# Patient Record
Sex: Male | Born: 1955
Health system: Southern US, Community
[De-identification: ages and names within clinical notes are randomized; demographics above are authoritative.]

## PROBLEM LIST (undated history)

## (undated) DIAGNOSIS — N401 Enlarged prostate with lower urinary tract symptoms: Secondary | ICD-10-CM

## (undated) DIAGNOSIS — E785 Hyperlipidemia, unspecified: Secondary | ICD-10-CM

## (undated) DIAGNOSIS — N138 Other obstructive and reflux uropathy: Secondary | ICD-10-CM

## (undated) DIAGNOSIS — Z860101 Personal history of adenomatous and serrated colon polyps: Secondary | ICD-10-CM

## (undated) DIAGNOSIS — H8110 Benign paroxysmal vertigo, unspecified ear: Secondary | ICD-10-CM

## (undated) DIAGNOSIS — J189 Pneumonia, unspecified organism: Secondary | ICD-10-CM

## (undated) DIAGNOSIS — R972 Elevated prostate specific antigen [PSA]: Secondary | ICD-10-CM

## (undated) DIAGNOSIS — Z86010 Personal history of colonic polyps: Secondary | ICD-10-CM

## (undated) DIAGNOSIS — R7989 Other specified abnormal findings of blood chemistry: Secondary | ICD-10-CM

## (undated) DIAGNOSIS — G4730 Sleep apnea, unspecified: Secondary | ICD-10-CM

## (undated) DIAGNOSIS — A6000 Herpesviral infection of urogenital system, unspecified: Secondary | ICD-10-CM

## (undated) DIAGNOSIS — R7303 Prediabetes: Secondary | ICD-10-CM

## (undated) DIAGNOSIS — R911 Solitary pulmonary nodule: Secondary | ICD-10-CM

## (undated) DIAGNOSIS — J309 Allergic rhinitis, unspecified: Secondary | ICD-10-CM

## (undated) HISTORY — PX: TRANSTHORACIC ECHOCARDIOGRAM: SHX275

## (undated) HISTORY — DX: Personal history of adenomatous and serrated colon polyps: Z86.0101

## (undated) HISTORY — DX: Other obstructive and reflux uropathy: N13.8

## (undated) HISTORY — DX: Pneumonia, unspecified organism: J18.9

## (undated) HISTORY — DX: Solitary pulmonary nodule: R91.1

## (undated) HISTORY — DX: Benign paroxysmal vertigo, unspecified ear: H81.10

## (undated) HISTORY — PX: OTHER SURGICAL HISTORY: SHX169

## (undated) HISTORY — DX: Personal history of colonic polyps: Z86.010

## (undated) HISTORY — DX: Other obstructive and reflux uropathy: N40.1

## (undated) HISTORY — DX: Hyperlipidemia, unspecified: E78.5

## (undated) HISTORY — DX: Prediabetes: R73.03

## (undated) HISTORY — DX: Other specified abnormal findings of blood chemistry: R79.89

## (undated) HISTORY — DX: Elevated prostate specific antigen (PSA): R97.20

## (undated) HISTORY — DX: Allergic rhinitis, unspecified: J30.9

## (undated) HISTORY — PX: ABLATION, PROSTATE, TRANSURETHRAL, USING WATERJET: SHX7150

## (undated) HISTORY — DX: Herpesviral infection of urogenital system, unspecified: A60.00

## (undated) HISTORY — DX: Sleep apnea, unspecified: G47.30

---
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
## 2010-04-13 HISTORY — PX: COLONOSCOPY: SHX174

## 2016-06-12 LAB — HEPATIC FUNCTION PANEL
ALT: 15 (ref 10–40)
AST: 25 (ref 14–40)
Alkaline Phosphatase: 69 (ref 25–125)
Bilirubin, Total: 0.7

## 2016-06-12 LAB — BASIC METABOLIC PANEL
BUN: 16 (ref 4–21)
Creatinine: 1.3 (ref 0.6–1.3)
Glucose: 86
Potassium: 3.8 (ref 3.4–5.3)
Sodium: 140 (ref 137–147)

## 2016-06-12 LAB — LIPID PANEL
Cholesterol: 209 — AB (ref 0–200)
HDL: 74 — AB (ref 35–70)
LDL Cholesterol: 128
LDl/HDL Ratio: 1.7
Triglycerides: 36 — AB (ref 40–160)

## 2016-06-12 LAB — PSA: PSA: 6.51

## 2016-06-12 LAB — TSH: TSH: 5.43 (ref 0.41–5.90)

## 2016-06-24 LAB — VITAMIN B12: Vitamin B-12: 746

## 2016-10-07 LAB — CBC AND DIFFERENTIAL
HCT: 41 (ref 41–53)
HEMOGLOBIN: 14 (ref 13.5–17.5)
Neutrophils Absolute: 3
Platelets: 217 (ref 150–399)
WBC: 5.9

## 2016-10-07 LAB — IRON,TIBC AND FERRITIN PANEL
%SAT: 33
Ferritin: 155
Iron: 108
TIBC: 328

## 2016-10-17 LAB — FECAL OCCULT BLOOD, GUAIAC: Fecal Occult Blood: NEGATIVE

## 2017-12-17 DIAGNOSIS — L570 Actinic keratosis: Secondary | ICD-10-CM | POA: Diagnosis not present

## 2017-12-17 DIAGNOSIS — X32XXXA Exposure to sunlight, initial encounter: Secondary | ICD-10-CM | POA: Diagnosis not present

## 2017-12-17 DIAGNOSIS — D225 Melanocytic nevi of trunk: Secondary | ICD-10-CM | POA: Diagnosis not present

## 2017-12-17 DIAGNOSIS — L821 Other seborrheic keratosis: Secondary | ICD-10-CM | POA: Diagnosis not present

## 2017-12-17 DIAGNOSIS — Z1283 Encounter for screening for malignant neoplasm of skin: Secondary | ICD-10-CM | POA: Diagnosis not present

## 2018-05-06 DIAGNOSIS — H8110 Benign paroxysmal vertigo, unspecified ear: Secondary | ICD-10-CM | POA: Diagnosis not present

## 2018-05-06 DIAGNOSIS — J3089 Other allergic rhinitis: Secondary | ICD-10-CM | POA: Diagnosis not present

## 2018-06-09 ENCOUNTER — Ambulatory Visit: Payer: 59 | Admitting: Family Medicine

## 2018-06-09 ENCOUNTER — Encounter: Payer: Self-pay | Admitting: Family Medicine

## 2018-06-09 VITALS — BP 113/75 | HR 74 | Temp 98.3°F | Resp 16 | Ht 69.5 in | Wt 191.0 lb

## 2018-06-09 DIAGNOSIS — R7989 Other specified abnormal findings of blood chemistry: Secondary | ICD-10-CM

## 2018-06-09 DIAGNOSIS — E663 Overweight: Secondary | ICD-10-CM

## 2018-06-09 DIAGNOSIS — Z1211 Encounter for screening for malignant neoplasm of colon: Secondary | ICD-10-CM

## 2018-06-09 DIAGNOSIS — E611 Iron deficiency: Secondary | ICD-10-CM | POA: Diagnosis not present

## 2018-06-09 DIAGNOSIS — J300 Vasomotor rhinitis: Secondary | ICD-10-CM

## 2018-06-09 DIAGNOSIS — N401 Enlarged prostate with lower urinary tract symptoms: Secondary | ICD-10-CM

## 2018-06-09 DIAGNOSIS — E7800 Pure hypercholesterolemia, unspecified: Secondary | ICD-10-CM | POA: Diagnosis not present

## 2018-06-09 DIAGNOSIS — R7301 Impaired fasting glucose: Secondary | ICD-10-CM

## 2018-06-09 DIAGNOSIS — R972 Elevated prostate specific antigen [PSA]: Secondary | ICD-10-CM

## 2018-06-09 DIAGNOSIS — Z23 Encounter for immunization: Secondary | ICD-10-CM

## 2018-06-09 DIAGNOSIS — Z0000 Encounter for general adult medical examination without abnormal findings: Secondary | ICD-10-CM

## 2018-06-09 DIAGNOSIS — R0683 Snoring: Secondary | ICD-10-CM

## 2018-06-09 DIAGNOSIS — J309 Allergic rhinitis, unspecified: Secondary | ICD-10-CM

## 2018-06-09 LAB — COMPREHENSIVE METABOLIC PANEL
ALT: 19 U/L (ref 0–53)
AST: 31 U/L (ref 0–37)
Albumin: 4.5 g/dL (ref 3.5–5.2)
Alkaline Phosphatase: 63 U/L (ref 39–117)
BUN: 16 mg/dL (ref 6–23)
CO2: 27 mEq/L (ref 19–32)
Calcium: 9.5 mg/dL (ref 8.4–10.5)
Chloride: 101 mEq/L (ref 96–112)
Creatinine, Ser: 1.18 mg/dL (ref 0.40–1.50)
GFR: 62.31 mL/min (ref >60.00)
Glucose, Bld: 85 mg/dL (ref 70–99)
Potassium: 4.2 mEq/L (ref 3.5–5.1)
Sodium: 137 mEq/L (ref 135–145)
Total Bilirubin: 0.6 mg/dL (ref 0.2–1.2)
Total Protein: 7.5 g/dL (ref 6.0–8.3)

## 2018-06-09 LAB — CBC WITH DIFFERENTIAL/PLATELET
BASOS ABS: 0.1 10*3/uL (ref 0.0–0.1)
Basophils Relative: 1.2 % (ref 0.0–3.0)
EOS ABS: 0.2 10*3/uL (ref 0.0–0.7)
Eosinophils Relative: 3.3 % (ref 0.0–5.0)
HCT: 43.3 % (ref 39.0–52.0)
Hemoglobin: 15 g/dL (ref 13.0–17.0)
Lymphocytes Relative: 26.1 % (ref 12.0–46.0)
Lymphs Abs: 1.2 10*3/uL (ref 0.7–4.0)
MCHC: 34.7 g/dL (ref 30.0–36.0)
MCV: 96.6 fl (ref 78.0–100.0)
Monocytes Absolute: 0.6 10*3/uL (ref 0.1–1.0)
Monocytes Relative: 12.9 % — ABNORMAL HIGH (ref 3.0–12.0)
Neutro Abs: 2.7 10*3/uL (ref 1.4–7.7)
Neutrophils Relative %: 56.5 % (ref 43.0–77.0)
Platelets: 326 10*3/uL (ref 150.0–400.0)
RBC: 4.49 Mil/uL (ref 4.22–5.81)
RDW: 13.3 % (ref 11.5–15.5)
WBC: 4.7 10*3/uL (ref 4.0–10.5)

## 2018-06-09 LAB — T4, FREE: Free T4: 0.72 ng/dL (ref 0.60–1.60)

## 2018-06-09 LAB — LIPID PANEL
CHOLESTEROL: 226 mg/dL — AB (ref 0–200)
HDL: 70.2 mg/dL (ref >39.00)
LDL Cholesterol: 135 mg/dL — ABNORMAL HIGH (ref 0–99)
NonHDL: 155.79
Total CHOL/HDL Ratio: 3
Triglycerides: 103 mg/dL (ref 0.0–149.0)
VLDL: 20.6 mg/dL (ref 0.0–40.0)

## 2018-06-09 LAB — HEMOGLOBIN A1C: Hgb A1c MFr Bld: 5.7 % (ref 4.6–6.5)

## 2018-06-09 LAB — FERRITIN: Ferritin: 151.6 ng/mL (ref 22.0–322.0)

## 2018-06-09 LAB — TSH: TSH: 5.02 u[IU]/mL — ABNORMAL HIGH (ref 0.35–4.50)

## 2018-06-09 LAB — PSA: PSA: 9.05 ng/mL — ABNORMAL HIGH (ref 0.10–4.00)

## 2018-06-09 NOTE — Progress Notes (Signed)
Office Note 06/09/2018  CC:  Chief Complaint  Patient presents with  . Establish Care    Previous PCP: [facility] in [place], [place] Dr. Theda Sers  . Abnormal Lab    thyroid, iron, CBC, PSA, lipid    HPI:  Christopher Banks is a 63 y.o.  male who is here to establish care Patient's most recent primary MD: see above Old records were not available for review prior to or during today's visit.  Most recent CPE with labs was 06/2016: reviewed labs today. Main issues:  Nocturia x 3, also urinary frequency in daytime, stream intermittenly poor, some incomplete bladder emptying, no pain with urination.  No hx of urinary infections.  Tamsulosin->dizziness.  No other meds.  Hx of elevated PSA: steadily increasing since 12/2014--> 3.95 12/2014, 4.68 May 30, 2015, and 6.51 Mar 2018. Pt states he has had no abnl prostate exams except enlargement.  Has never seen a urologist.  TSH ULN 2016 and 2017, then was 5.43 06/2016.  NO T4 or T3 checked. Needs rechecking.  Iron low 06/2016 (26, % sat = 8, normal TIBC and ferritin and Hb and MCV).  Pt took iron supp x 3 mo and iron rose to normal range.  He recently restarted a daily ferrous sulfate 325mg  tab a few weeks ago due to feeling some fatigue. IFOB 10/2016 was NEG.  CMET all normal 2018.  Vit B12 normal 2016 and 2018.  His wife says that he sometimes awakes from sleep with a gasp.  Pt not sure about apneic events in sleep. He denies any excessive daytime sleepiness.  No morning headaches. He does have night time nasal congestion that is bothersome, says this responded to a nasal spray in the past but he cannot recall the name.  Past Medical History:  Diagnosis Date  . Allergic rhinitis    dust mites  . BPH with obstruction/lower urinary tract symptoms   . Elevated TSH    2018, not taking biotin.  Marland Kitchen Hyperlipidemia    borderline-->meds not recommended in the past    Past Surgical History:  Procedure Laterality Date  . COLONOSCOPY   2012   Normal    Family History  Problem Relation Age of Onset  . Hearing loss Mother   . Hyperlipidemia Mother   . Irritable bowel syndrome Sister   . Other Brother        elevated PSA  . Diabetes Maternal Grandfather     Social History   Socioeconomic History  . Marital status: Married    Spouse name: Not on file  . Number of children: Not on file  . Years of education: Not on file  . Highest education level: Not on file  Occupational History  . Not on file  Social Needs  . Financial resource strain: Not on file  . Food insecurity:    Worry: Not on file    Inability: Not on file  . Transportation needs:    Medical: Not on file    Non-medical: Not on file  Tobacco Use  . Smoking status: Never Smoker  . Smokeless tobacco: Never Used  Substance and Sexual Activity  . Alcohol use: Yes    Comment: occasionally  . Drug use: Never  . Sexual activity: Not on file  Lifestyle  . Physical activity:    Days per week: Not on file    Minutes per session: Not on file  . Stress: Not on file  Relationships  . Social connections:  Talks on phone: Not on file    Gets together: Not on file    Attends religious service: Not on file    Active member of club or organization: Not on file    Attends meetings of clubs or organizations: Not on file    Relationship status: Not on file  . Intimate partner violence:    Fear of current or ex partner: Not on file    Emotionally abused: Not on file    Physically abused: Not on file    Forced sexual activity: Not on file  Other Topics Concern  . Not on file  Social History Narrative   Married, 2 children.   Orig from [place], relocated to [place].   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   No tob.   Alc: 2 glasses of wine per night.    Outpatient Encounter Medications as of 06/09/2018  Medication Sig  . Cholecalciferol (VITAMIN D3) 50 MCG (2000 UT) capsule Take 2,000 Units by mouth daily.  . ferrous  sulfate 325 (65 FE) MG EC tablet Take 325 mg by mouth daily.  . Multiple Vitamin (MULTIVITAMIN) tablet Take 1 tablet by mouth daily.   No facility-administered encounter medications on file as of 06/09/2018.     Allergies  Allergen Reactions  . Tamsulosin Other (See Comments)    lightheadedness    ROS Review of Systems  Constitutional: Positive for fatigue (mild x few weeks). Negative for appetite change, chills and fever.  HENT: Positive for congestion (nasal). Negative for dental problem, ear pain and sore throat.   Eyes: Negative for discharge, redness and visual disturbance.  Respiratory: Positive for cough (mild, x 3 d, with uri sx's). Negative for chest tightness, shortness of breath and wheezing.   Cardiovascular: Negative for chest pain, palpitations and leg swelling.  Gastrointestinal: Negative for abdominal pain, blood in stool, diarrhea, nausea and vomiting.  Genitourinary: Positive for difficulty urinating and frequency. Negative for dysuria, flank pain and hematuria.  Musculoskeletal: Negative for arthralgias, back pain, joint swelling, myalgias and neck stiffness.  Skin: Negative for pallor and rash.  Neurological: Negative for dizziness, speech difficulty, weakness and headaches.  Hematological: Negative for adenopathy. Does not bruise/bleed easily.  Psychiatric/Behavioral: Negative for confusion and sleep disturbance. The patient is not nervous/anxious.     PE; Blood pressure 113/75, pulse 74, temperature 98.3 F (36.8 C), temperature source Oral, resp. rate 16, height 5' 9.5" (1.765 m), weight 191 lb (86.6 kg), SpO2 95 %. Body mass index is 27.8 kg/m.  Gen: Alert, well appearing.  Patient is oriented to person, place, time, and situation. AFFECT: pleasant, lucid thought and speech. ENT: Ears: EACs clear, normal epithelium.  TMs with good light reflex and landmarks bilaterally.  Eyes: no injection, icteris, swelling, or exudate.  EOMI, PERRLA. Nose: no drainage or  turbinate edema/swelling.  No injection or focal lesion.  Mouth: lips without lesion/swelling.  Oral mucosa pink and moist.  Dentition intact and without obvious caries or gingival swelling.  Oropharynx without erythema, exudate, or swelling.  Neck: supple/nontender.  No LAD, mass, or TM.  Carotid pulses 2+ bilaterally, without bruits. CV: RRR, no m/r/g.   LUNGS: CTA bilat, nonlabored resps, good aeration in all lung fields. ABD: soft, NT, ND, BS normal.  No hepatospenomegaly or mass.  No bruits. EXT: no clubbing, cyanosis, or edema.  Musculoskeletal: no joint swelling, erythema, warmth, or tenderness.  ROM of all joints intact. Skin - no sores or suspicious lesions or  rashes or color changes Rectal exam: negative without mass, lesions or tenderness, PROSTATE EXAM: smooth and symmetric without nodules or tenderness, enlarged 1+.   Pertinent labs:  none  ASSESSMENT AND PLAN:   New pt;  1) Viral URI, symptomatic care discussed.  2) Vasomotor rhinitis vs allergic rhinitis: I recommended he start otc flonase and see how things go. If not helpful will rx astelin spray.  3) Hx of iron def (w/out anemia): recheck iron panel and CBC today.  4) Hx of elevated PSA: trending upward the last 3 checks. Repeat today. Suspect he'll need to see a urologist.  5) BPH: flomax caused lightheadedness.  Will hold off on any trial of different bph treatment until we see how his PSA issue goes.  6) Borderline hyperlipidemia: reviewed lipid panels pt brought with him and these show only mild LDL elevations and low chol/hdl ratios.  Repeat FLP today.  7) Snoring: nasal congestion-->see #2 above.   No apneic events witnessed and no problem with excessive daytime sleepiness. Watchful waiting at this time.  8) Elevated TSH: VERY mild elevation, no T4 or T3 checked yet. Will repeat TSH today and also check T4 and T3.  9) Health maintenance exam: Reviewed age and gender appropriate health maintenance issues  (prudent diet, regular exercise, health risks of tobacco and excessive alcohol, use of seatbelts, fire alarms in home, use of sunscreen).  Also reviewed age and gender appropriate health screening as well as vaccine recommendations. Vaccines: flu vaccine today. Labs: CBC, CMET, FLP, TSH. Prostate ca screening: DRE normal today, PSA drawn. Colon ca screening: next colonoscopy due 2022.  An After Visit Summary was printed and given to the patient.  Return for f/u to be determined based on lab work up.  Signed:  Crissie Sickles, MD           06/09/2018

## 2018-06-10 ENCOUNTER — Encounter: Payer: Self-pay | Admitting: Family Medicine

## 2018-06-10 ENCOUNTER — Other Ambulatory Visit: Payer: Self-pay | Admitting: Family Medicine

## 2018-06-10 DIAGNOSIS — R972 Elevated prostate specific antigen [PSA]: Secondary | ICD-10-CM

## 2018-06-10 LAB — T3: T3, Total: 84 ng/dL (ref 76–181)

## 2018-06-10 NOTE — Progress Notes (Signed)
Orders only

## 2018-08-08 DIAGNOSIS — R972 Elevated prostate specific antigen [PSA]: Secondary | ICD-10-CM | POA: Diagnosis not present

## 2018-08-08 LAB — PSA: PSA: 9

## 2018-08-12 ENCOUNTER — Encounter: Payer: Self-pay | Admitting: Family Medicine

## 2018-09-12 HISTORY — PX: PROSTATE BIOPSY: SHX241

## 2018-09-22 DIAGNOSIS — R972 Elevated prostate specific antigen [PSA]: Secondary | ICD-10-CM | POA: Diagnosis not present

## 2018-09-30 DIAGNOSIS — R972 Elevated prostate specific antigen [PSA]: Secondary | ICD-10-CM | POA: Diagnosis not present

## 2018-10-03 ENCOUNTER — Encounter: Payer: Self-pay | Admitting: Family Medicine

## 2018-10-24 ENCOUNTER — Other Ambulatory Visit: Payer: Self-pay

## 2018-10-24 ENCOUNTER — Other Ambulatory Visit: Payer: Self-pay | Admitting: Family Medicine

## 2019-03-14 DIAGNOSIS — U071 COVID-19: Secondary | ICD-10-CM

## 2019-03-14 HISTORY — DX: COVID-19: U07.1

## 2019-03-28 DIAGNOSIS — R972 Elevated prostate specific antigen [PSA]: Secondary | ICD-10-CM | POA: Diagnosis not present

## 2019-04-04 ENCOUNTER — Other Ambulatory Visit: Payer: 59

## 2019-04-04 DIAGNOSIS — U071 COVID-19: Secondary | ICD-10-CM | POA: Diagnosis not present

## 2019-04-04 DIAGNOSIS — Z20828 Contact with and (suspected) exposure to other viral communicable diseases: Secondary | ICD-10-CM | POA: Diagnosis not present

## 2019-04-27 ENCOUNTER — Ambulatory Visit (INDEPENDENT_AMBULATORY_CARE_PROVIDER_SITE_OTHER): Payer: BC Managed Care – PPO | Admitting: Family Medicine

## 2019-04-27 ENCOUNTER — Encounter: Payer: Self-pay | Admitting: Family Medicine

## 2019-04-27 ENCOUNTER — Ambulatory Visit (HOSPITAL_BASED_OUTPATIENT_CLINIC_OR_DEPARTMENT_OTHER)
Admission: RE | Admit: 2019-04-27 | Discharge: 2019-04-27 | Disposition: A | Discharge status: Home / Self Care | Payer: BC Managed Care – PPO | Source: Ambulatory Visit | Attending: Family Medicine | Admitting: Family Medicine

## 2019-04-27 ENCOUNTER — Other Ambulatory Visit: Payer: Self-pay

## 2019-04-27 VITALS — BP 125/77 | HR 93 | Temp 97.9°F | Resp 16 | Wt 190.0 lb

## 2019-04-27 DIAGNOSIS — R0609 Other forms of dyspnea: Secondary | ICD-10-CM

## 2019-04-27 DIAGNOSIS — R0602 Shortness of breath: Secondary | ICD-10-CM | POA: Diagnosis not present

## 2019-04-27 DIAGNOSIS — R0600 Dyspnea, unspecified: Secondary | ICD-10-CM | POA: Diagnosis not present

## 2019-04-27 DIAGNOSIS — R000 Tachycardia, unspecified: Secondary | ICD-10-CM | POA: Insufficient documentation

## 2019-04-27 DIAGNOSIS — Z8616 Personal history of COVID-19: Secondary | ICD-10-CM | POA: Diagnosis not present

## 2019-04-27 NOTE — Progress Notes (Signed)
OFFICE VISIT  04/27/2019   CC:  Chief Complaint  Patient presents with  . Follow-up    Covid + test on 12/20 and hasn't felt right since.    HPI:    Patient is a 64 y.o.  male who presents for f/u of recent covid 19 infection. Covid + on 04/04/19 (23 days ago).  Onset 12/22, fever only, no other sx's at all.  Lasted couple days and felt back to baseline. He then started checking oxygen at home (still felt fine though) and it was 94% consistently. However, HR registering 80-110.  He felt no palpitations.  At rest he has no SOB.  With activity like a normal walk he feels SOB.  Feels no more SOB after resting 5-10 seconds. No chest pain, possibly a little pressure.  No legs swelling or pain. Good appetite, eating normally.  Urinating fine, no GI complaint.  ROS: no fever, no wheezing, no cough, no dizziness, no HAs, no rashes, no melena/hematochezia.  No polyuria or polydipsia.  No myalgias or arthralgias. No focal weakness, no generalized weakness, no tremor, no vision or hearing c/o, no n/v/d. No cold/heat intolerance.  No known swollen glands.     Past Medical History:  Diagnosis Date  . Allergic rhinitis    dust mites  . BPH with obstruction/lower urinary tract symptoms   . BPPV (benign paroxysmal positional vertigo)   . Elevated PSA    Urol 08/08/18-->prostate bx to be done (Dr. Alyson Ingles).  . Elevated TSH    2018, not taking biotin. 2020, same-->T4/T3 normal.  . Hyperlipidemia    borderline-->meds not recommended in the past  Prostate bx was NEG 08/2018.  Past Surgical History:  Procedure Laterality Date  . COLONOSCOPY  2012   Normal   Silodasin instead of alfuzosin. Outpatient Medications Prior to Visit  Medication Sig Dispense Refill  . Cholecalciferol (VITAMIN D3) 50 MCG (2000 UT) capsule Take 2,000 Units by mouth daily.    . ferrous sulfate 325 (65 FE) MG EC tablet Take 325 mg by mouth daily.    . fluticasone (FLONASE) 50 MCG/ACT nasal spray INSTILL 2 SPRAYS INTO  EACH NOSTRIL EVERY DAY 16 g 1  . Multiple Vitamin (MULTIVITAMIN) tablet Take 1 tablet by mouth daily.    Marland Kitchen alfuzosin (UROXATRAL) 10 MG 24 hr tablet TK 1 T PO HS    . fluticasone (FLONASE) 50 MCG/ACT nasal spray INSTILL 2 SPRAYS INTO EACH NOSTRIL QD     No facility-administered medications prior to visit.    Allergies  Allergen Reactions  . Tamsulosin Other (See Comments)    lightheadedness    ROS As per HPI  PE: Blood pressure 125/77, pulse 93, temperature 97.9 F (36.6 C), temperature source Temporal, resp. rate 16, weight 190 lb (86.2 kg), SpO2 94 %. Body mass index is 27.66 kg/m.  Gen: Alert, well appearing.  Patient is oriented to person, place, time, and situation. AFFECT: pleasant, lucid thought and speech. [id number]: no injection, icteris, swelling, or exudate.  EOMI, PERRLA. Mouth: lips without lesion/swelling.  Oral mucosa pink and moist. Oropharynx without erythema, exudate, or swelling.  CV: RRR, no m/r/g.   LUNGS: CTA bilat, nonlabored resps, good aeration in all lung fields. ABD: soft, NT/ND. EXT: no clubbing or cyanosis.  no edema.  Skin - no sores or suspicious lesions or rashes or color changes Musculoskeletal: no joint swelling, erythema, warmth, or tenderness.  ROM of all joints intact. Neuro: CN 2-12 intact bilaterally, strength 5/5 in proximal and distal upper extremities  and lower extremities bilaterally.    No tremor.    No ataxia.     LABS:   Lab Results  Component Value Date   TSH 5.02 (H) 06/09/2018   T3TOTAL 84 06/09/2018    Lab Results  Component Value Date   WBC 4.7 06/09/2018   HGB 15.0 06/09/2018   HCT 43.3 06/09/2018   MCV 96.6 06/09/2018   PLT 326.0 06/09/2018   Lab Results  Component Value Date   CREATININE 1.18 06/09/2018   BUN 16 06/09/2018   NA 137 06/09/2018   K 4.2 06/09/2018   CL 101 06/09/2018   CO2 27 06/09/2018   Lab Results  Component Value Date   ALT 19 06/09/2018   AST 31 06/09/2018   ALKPHOS 63 06/09/2018    BILITOT 0.6 06/09/2018   Lab Results  Component Value Date   CHOL 226 (H) 06/09/2018   Lab Results  Component Value Date   HDL 70.20 06/09/2018   Lab Results  Component Value Date   LDLCALC 135 (H) 06/09/2018   Lab Results  Component Value Date   TRIG 103.0 06/09/2018   Lab Results  Component Value Date   CHOLHDL 3 06/09/2018   Lab Results  Component Value Date   PSA 9 08/08/2018   PSA 9.05 (H) 06/09/2018   PSA 6.51 06/12/2016   Lab Results  Component Value Date   HGBA1C 5.7 06/09/2018   12 lead EKG today: (no prior EKG for comparison).  NSR, rate 73, no ischemic changes, no ectopy, intervals and duration normal.  Low voltage aVL, III, and aVF.  Tiny isolated Q wave in III. Essentially normal EKG.  IMPRESSION AND PLAN:  Post-covid DOE and intermittent tachycardia (mild->up to 110 or so). Normal exam, EKG normal. W/u to r/o pneumonia, PE, CHF. LABS: CBC w/diff, BMET (stat), D-dimer (stat), TSH, BNP. CXR now. If D dimer + then CT angio chest.  An After Visit Summary was printed and given to the patient.  FOLLOW UP: Return for f/u to be determined based on results of w/u.  Signed:  Crissie Sickles, MD           04/27/2019

## 2019-04-28 ENCOUNTER — Other Ambulatory Visit: Payer: Self-pay | Admitting: Family Medicine

## 2019-04-28 ENCOUNTER — Telehealth: Payer: Self-pay | Admitting: Family Medicine

## 2019-04-28 DIAGNOSIS — R911 Solitary pulmonary nodule: Secondary | ICD-10-CM

## 2019-04-28 LAB — CBC WITH DIFFERENTIAL/PLATELET
Absolute Monocytes: 374 cells/uL (ref 200–950)
Basophils Absolute: 20 cells/uL (ref 0–200)
Basophils Relative: 0.5 %
Eosinophils Absolute: 140 cells/uL (ref 15–500)
Eosinophils Relative: 3.6 %
HCT: 44.7 % (ref 38.5–50.0)
Hemoglobin: 15 g/dL (ref 13.2–17.1)
Lymphs Abs: 1232 cells/uL (ref 850–3900)
MCH: 32.5 pg (ref 27.0–33.0)
MCHC: 33.6 g/dL (ref 32.0–36.0)
MCV: 96.8 fL (ref 80.0–100.0)
MPV: 9.1 fL (ref 7.5–12.5)
Monocytes Relative: 9.6 %
Neutro Abs: 2133 cells/uL (ref 1500–7800)
Neutrophils Relative %: 54.7 %
Platelets: 307 10*3/uL (ref 140–400)
RBC: 4.62 10*6/uL (ref 4.20–5.80)
RDW: 11.8 % (ref 11.0–15.0)
Total Lymphocyte: 31.6 %
WBC: 3.9 10*3/uL (ref 3.8–10.8)

## 2019-04-28 LAB — BASIC METABOLIC PANEL
BUN: 18 mg/dL (ref 7–25)
CO2: 28 mmol/L (ref 20–32)
Calcium: 9.7 mg/dL (ref 8.6–10.3)
Chloride: 103 mmol/L (ref 98–110)
Creat: 1.1 mg/dL (ref 0.70–1.25)
Glucose, Bld: 110 mg/dL — ABNORMAL HIGH (ref 65–99)
Potassium: 4.4 mmol/L (ref 3.5–5.3)
Sodium: 136 mmol/L (ref 135–146)

## 2019-04-28 LAB — D-DIMER, QUANTITATIVE: D-Dimer, Quant: 0.33 mcg/mL FEU (ref <0.50)

## 2019-04-28 LAB — TSH: TSH: 3.97 mIU/L (ref 0.40–4.50)

## 2019-04-28 LAB — BRAIN NATRIURETIC PEPTIDE: Brain Natriuretic Peptide: 10 pg/mL (ref <100)

## 2019-04-28 NOTE — Telephone Encounter (Signed)
Patient states he is returning a call from Dr. Anitra Lauth from last night.  Please call  [phone]

## 2019-04-28 NOTE — Telephone Encounter (Signed)
I called pt at 12:15 again today and discussed all results with him. He does not need a call-back at this time.

## 2019-04-28 NOTE — Telephone Encounter (Signed)
Did you contact patient last night? He was advised of lab results but did have CXR done yesterday.

## 2019-05-04 DIAGNOSIS — R972 Elevated prostate specific antigen [PSA]: Secondary | ICD-10-CM | POA: Diagnosis not present

## 2019-05-04 LAB — PSA: PSA: 7.3

## 2019-05-12 ENCOUNTER — Other Ambulatory Visit: Payer: BC Managed Care – PPO

## 2019-05-15 ENCOUNTER — Other Ambulatory Visit: Payer: Self-pay

## 2019-05-15 ENCOUNTER — Ambulatory Visit
Admission: RE | Admit: 2019-05-15 | Discharge: 2019-05-15 | Disposition: A | Discharge status: Home / Self Care | Payer: BC Managed Care – PPO | Source: Ambulatory Visit | Attending: Family Medicine | Admitting: Family Medicine

## 2019-05-15 DIAGNOSIS — R918 Other nonspecific abnormal finding of lung field: Secondary | ICD-10-CM | POA: Diagnosis not present

## 2019-05-15 DIAGNOSIS — R911 Solitary pulmonary nodule: Secondary | ICD-10-CM

## 2019-05-15 MED ORDER — IOPAMIDOL (ISOVUE-300) INJECTION 61%
75.0000 mL | Freq: Once | INTRAVENOUS | Status: AC | PRN
  Administered 2019-05-15: 75 mL via INTRAVENOUS

## 2019-05-16 ENCOUNTER — Encounter: Payer: Self-pay | Admitting: Family Medicine

## 2019-05-16 ENCOUNTER — Telehealth: Payer: Self-pay

## 2019-05-16 NOTE — Telephone Encounter (Signed)
Patient had several questions for PCP. Advised to make virtual to further discuss recent CT results. Pt scheduled.

## 2019-05-16 NOTE — Telephone Encounter (Signed)
Received results from CT.  Has some additional questions.  Please call [phone]

## 2019-05-17 ENCOUNTER — Encounter: Payer: Self-pay | Admitting: Family Medicine

## 2019-05-17 ENCOUNTER — Ambulatory Visit (INDEPENDENT_AMBULATORY_CARE_PROVIDER_SITE_OTHER): Payer: BC Managed Care – PPO | Admitting: Family Medicine

## 2019-05-17 ENCOUNTER — Other Ambulatory Visit: Payer: Self-pay

## 2019-05-17 VITALS — Temp 97.6°F | Wt 187.0 lb

## 2019-05-17 DIAGNOSIS — R000 Tachycardia, unspecified: Secondary | ICD-10-CM | POA: Diagnosis not present

## 2019-05-17 DIAGNOSIS — R0602 Shortness of breath: Secondary | ICD-10-CM | POA: Diagnosis not present

## 2019-05-17 DIAGNOSIS — Z8616 Personal history of COVID-19: Secondary | ICD-10-CM | POA: Diagnosis not present

## 2019-05-17 NOTE — Progress Notes (Signed)
Virtual Visit via Video Note  I connected with Christopher Banks on 05/17/19 at  8:00 AM EST by a video enabled telemedicine application and verified that I am speaking with the correct person using two identifiers.  Location patient: home Location provider:work or home office Persons participating in the virtual visit: patient, provider  I discussed the limitations of evaluation and management by telemedicine and the availability of in person appointments. The patient expressed understanding and agreed to proceed.  Telemedicine visit is a necessity given the COVID-19 restrictions in place at the current time.  HPI: 64 y/o male being seen today for "discuss CT results". He had a CXR done 04/27/19 (covid +, SOB) that was normal other than a small nodular opacity in lower lung region on lateral view.  CT chest with contrast was recommended by radiologist for further evaluation. This CT was done 05/15/19 and showed: IMPRESSION: The nodular density seen on recent chest x-ray corresponds to endplate calcifications inferiorly on the left at T11.  There is however a 5 mm nodule within the right lower lobe as described. No other sizable nodules are seen. No follow-up needed if patient is low-risk. Non-contrast chest CT can be considered in 12 months if patient is high-risk. This recommendation follows the consensus statement: Guidelines for Management of Incidental Pulmonary Nodules Detected on CT Images: From the Fleischner Society 2017; Radiology 2017; 284:228-243. 8 mm hypodense nodule within the right lobe of the thyroid. Given the patient's age and size of the lesion, no followup is recommended(ref: J Am Coll Radiol. 2015 Feb;12(2): 143-50).  He is still having some feeling of mild SOB when he gets up and tries to be active. No palpitations or CP.  No diaphoresis, nausea, dizziness, or cough.  No fevers. This morning-->typical morning-> HR 70s at rest, goes to a bit over 100 when gets up and moves  around.  Oxygen sats normal.   No fatigue or malaise.   ROS: See pertinent positives and negatives per HPI.  Past Medical History:  Diagnosis Date  . Allergic rhinitis    dust mites  . BPH with obstruction/lower urinary tract symptoms   . BPPV (benign paroxysmal positional vertigo)   . Elevated PSA    Urol 08/08/18-->prostate bx to be done (Dr. Alyson Ingles).  . Elevated TSH    2018, not taking biotin. 2020, same-->T4/T3 normal.  . Hyperlipidemia    borderline-->meds not recommended in the past  . Pulmonary nodule Jan/feb 2021   5 mm R lung; Christopher Banks low risk for lung ca so NO F/U IMAGING IS INDICATED.    Past Surgical History:  Procedure Laterality Date  . COLONOSCOPY  2012   Normal    Family History  Problem Relation Age of Onset  . Hearing loss Mother   . Hyperlipidemia Mother   . Irritable bowel syndrome Sister   . Other Brother        elevated PSA  . Diabetes Maternal Grandfather    Social History   Socioeconomic History  . Marital status: Married    Spouse name: Not on file  . Number of children: Not on file  . Years of education: Not on file  . Highest education level: Not on file  Occupational History  . Not on file  Tobacco Use  . Smoking status: Never Smoker  . Smokeless tobacco: Never Used  Substance and Sexual Activity  . Alcohol use: Yes    Comment: occasionally  . Drug use: Never  . Sexual activity: Not on file  Other Topics Concern  . Not on file  Social History Narrative   Married, 2 children.   Orig from [place], relocated to [place].   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   Alc: 2 glasses of wine per night.   Social Determinants of Health   Financial Resource Strain:   . Difficulty of Paying Living Expenses: Not on file  Food Insecurity:   . Worried About Charity fundraiser in the Last Year: Not on file  . Ran Out of Food in the Last Year: Not on file  Transportation Needs:   . Lack of Transportation  (Medical): Not on file  . Lack of Transportation (Non-Medical): Not on file  Physical Activity:   . Days of Exercise per Week: Not on file  . Minutes of Exercise per Session: Not on file  Stress:   . Feeling of Stress : Not on file  Social Connections:   . Frequency of Communication with Friends and Family: Not on file  . Frequency of Social Gatherings with Friends and Family: Not on file  . Attends Religious Services: Not on file  . Active Member of Clubs or Organizations: Not on file  . Attends Archivist Meetings: Not on file  . Marital Status: Not on file      Current Outpatient Medications:  .  alfuzosin (UROXATRAL) 10 MG 24 hr tablet, TK 1 T PO HS, Disp: , Rfl:  .  Cholecalciferol (VITAMIN D3) 50 MCG (2000 UT) capsule, Take 2,000 Units by mouth daily., Disp: , Rfl:  .  ferrous sulfate 325 (65 FE) MG EC tablet, Take 325 mg by mouth daily., Disp: , Rfl:  .  fluticasone (FLONASE) 50 MCG/ACT nasal spray, INSTILL 2 SPRAYS INTO EACH NOSTRIL EVERY DAY, Disp: 16 g, Rfl: 1 .  Multiple Vitamin (MULTIVITAMIN) tablet, Take 1 tablet by mouth daily., Disp: , Rfl:   EXAM:  VITALS per patient if applicable: Temp [id number] F (36.4 C) (Oral)   Wt 187 lb (84.8 kg)   BMI 27.22 kg/m    GENERAL: alert, oriented, appears well and in no acute distress  HEENT: atraumatic, conjunttiva clear, no obvious abnormalities on inspection of external nose and ears  NECK: normal movements of the head and neck  LUNGS: on inspection no signs of respiratory distress, breathing rate appears normal, no obvious gross SOB, gasping or wheezing  CV: no obvious cyanosis  MS: moves all visible extremities without noticeable abnormality  PSYCH/NEURO: pleasant and cooperative, no obvious depression or anxiety, speech and thought processing grossly intact  LABS: none today    Chemistry      Component Value Date/Time   NA 136 04/27/2019 0846   NA 140 06/12/2016 0000   K 4.4 04/27/2019 0846   CL 103  04/27/2019 0846   CO2 28 04/27/2019 0846   BUN 18 04/27/2019 0846   BUN 16 06/12/2016 0000   CREATININE 1.10 04/27/2019 0846   GLU 86 06/12/2016 0000      Component Value Date/Time   CALCIUM 9.7 04/27/2019 0846   ALKPHOS 63 06/09/2018 1007   AST 31 06/09/2018 1007   ALT 19 06/09/2018 1007   BILITOT 0.6 06/09/2018 1007     Lab Results  Component Value Date   WBC 3.9 04/27/2019   HGB 15.0 04/27/2019   HCT 44.7 04/27/2019   MCV 96.8 04/27/2019   PLT 307 04/27/2019   Lab Results  Component Value Date  DDIMER 0.33 04/27/2019   ASSESSMENT AND PLAN:  Discussed the following assessment and plan:  Covid 19 infection approx 6 wks ago, still with some residual feeling of SOB with activity. Otherwise feeling well. W/u has been unrevealing of any cause or red flags. Discussed recent CT chest as noted in HPI.  No f/u imaging indicated. Reassured. Watchful waiting, expect gradual resolution over time.   I discussed the assessment and treatment plan with the patient. The patient was provided an opportunity to ask questions and all were answered. The patient agreed with the plan and demonstrated an understanding of the instructions.   The patient was advised to call back or seek an in-person evaluation if the symptoms worsen or if the condition fails to improve as anticipated.  F/u: prn  Signed:  Crissie Sickles, MD           05/17/2019

## 2019-05-18 ENCOUNTER — Encounter: Payer: Self-pay | Admitting: Family Medicine

## 2019-05-22 ENCOUNTER — Other Ambulatory Visit: Payer: Self-pay | Admitting: Urology

## 2019-05-30 ENCOUNTER — Encounter: Payer: Self-pay | Admitting: Family Medicine

## 2019-07-13 ENCOUNTER — Encounter: Payer: Self-pay | Admitting: Family Medicine

## 2019-07-13 ENCOUNTER — Other Ambulatory Visit: Payer: Self-pay

## 2019-07-13 ENCOUNTER — Ambulatory Visit (INDEPENDENT_AMBULATORY_CARE_PROVIDER_SITE_OTHER): Payer: BC Managed Care – PPO | Admitting: Family Medicine

## 2019-07-13 VITALS — BP 119/77 | HR 74 | Temp 97.8°F | Resp 16 | Ht 69.5 in | Wt 192.2 lb

## 2019-07-13 DIAGNOSIS — Z0000 Encounter for general adult medical examination without abnormal findings: Secondary | ICD-10-CM | POA: Diagnosis not present

## 2019-07-13 DIAGNOSIS — J309 Allergic rhinitis, unspecified: Secondary | ICD-10-CM | POA: Diagnosis not present

## 2019-07-13 DIAGNOSIS — G4733 Obstructive sleep apnea (adult) (pediatric): Secondary | ICD-10-CM | POA: Diagnosis not present

## 2019-07-13 DIAGNOSIS — R7301 Impaired fasting glucose: Secondary | ICD-10-CM

## 2019-07-13 LAB — COMPREHENSIVE METABOLIC PANEL
ALT: 20 U/L (ref 0–53)
AST: 30 U/L (ref 0–37)
Albumin: 4.3 g/dL (ref 3.5–5.2)
Alkaline Phosphatase: 49 U/L (ref 39–117)
BUN: 16 mg/dL (ref 6–23)
CO2: 29 mEq/L (ref 19–32)
Calcium: 9.7 mg/dL (ref 8.4–10.5)
Chloride: 102 mEq/L (ref 96–112)
Creatinine, Ser: 1.21 mg/dL (ref 0.40–1.50)
GFR: 60.32 mL/min (ref >60.00)
Glucose, Bld: 108 mg/dL — ABNORMAL HIGH (ref 70–99)
Potassium: 4.6 mEq/L (ref 3.5–5.1)
Sodium: 134 mEq/L — ABNORMAL LOW (ref 135–145)
Total Bilirubin: 0.6 mg/dL (ref 0.2–1.2)
Total Protein: 6.7 g/dL (ref 6.0–8.3)

## 2019-07-13 LAB — LIPID PANEL
Cholesterol: 220 mg/dL — ABNORMAL HIGH (ref 0–200)
HDL: 73.3 mg/dL (ref >39.00)
LDL Cholesterol: 136 mg/dL — ABNORMAL HIGH (ref 0–99)
NonHDL: 146.59
Total CHOL/HDL Ratio: 3
Triglycerides: 55 mg/dL (ref 0.0–149.0)
VLDL: 11 mg/dL (ref 0.0–40.0)

## 2019-07-13 LAB — HEMOGLOBIN A1C: Hgb A1c MFr Bld: 5.5 % (ref 4.6–6.5)

## 2019-07-13 NOTE — Progress Notes (Signed)
Office Note 07/13/2019  CC:  Chief Complaint  Patient presents with  . Annual Exam    pt is fasting    HPI:  Christopher Banks is a 64 y.o. male who is here for annual health maintenance exam. Has been working out. Diet: healthy.  Eyes: no exam lately. Dental: UTD.  He reports that he snores bad, wife witnesses apneic events.  Some excessive daytime sleepiness. No morning HAs or elevated bp issues.  Nasal congestion, chronic, a bit better taking daily flonase. Has some mild disequilibrium lately, intermittent, often when standing up from sitting, likely related to his nasal congestion.  No vertigo.    Past Medical History:  Diagnosis Date  . Allergic rhinitis    dust mites  . BPH with obstruction/lower urinary tract symptoms   . BPPV (benign paroxysmal positional vertigo)   . Elevated PSA    Prostate bx 09/2018 NORMAL. PSA dec to 7.16 Apr 2019.  Marland Kitchen Elevated TSH    2018, not taking biotin. 2020, same-->T4/T3 normal.  . Hyperlipidemia    borderline-->meds not recommended in the past  . Pulmonary nodule Jan/feb 2021   5 mm R lung; pt low risk for lung ca so NO F/U IMAGING IS INDICATED.    Past Surgical History:  Procedure Laterality Date  . COLONOSCOPY  2012   Normal  . PROSTATE BIOPSY  09/2018   NORMAL    Family History  Problem Relation Age of Onset  . Hearing loss Mother   . Hyperlipidemia Mother   . Irritable bowel syndrome Sister   . Other Brother        elevated PSA  . Diabetes Maternal Grandfather     Social History   Socioeconomic History  . Marital status: Married    Spouse name: Not on file  . Number of children: Not on file  . Years of education: Not on file  . Highest education level: Not on file  Occupational History  . Not on file  Tobacco Use  . Smoking status: Never Smoker  . Smokeless tobacco: Never Used  Substance and Sexual Activity  . Alcohol use: Yes    Comment: occasionally  . Drug use: Never  . Sexual activity: Not on file   Other Topics Concern  . Not on file  Social History Narrative   Married, 2 children.   Orig from [place], relocated to [place].   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   No tob.   Alc: 2 glasses of wine per night.   Social Determinants of Health   Financial Resource Strain:   . Difficulty of Paying Living Expenses:   Food Insecurity:   . Worried About Charity fundraiser in the Last Year:   . Arboriculturist in the Last Year:   Transportation Needs:   . Film/video editor (Medical):   Marland Kitchen Lack of Transportation (Non-Medical):   Physical Activity:   . Days of Exercise per Week:   . Minutes of Exercise per Session:   Stress:   . Feeling of Stress :   Social Connections:   . Frequency of Communication with Friends and Family:   . Frequency of Social Gatherings with Friends and Family:   . Attends Religious Services:   . Active Member of Clubs or Organizations:   . Attends Archivist Meetings:   Marland Kitchen Marital Status:   Intimate Partner Violence:   . Fear of Current or Ex-Partner:   .  Emotionally Abused:   Marland Kitchen Physically Abused:   . Sexually Abused:     Outpatient Medications Prior to Visit  Medication Sig Dispense Refill  . Cholecalciferol (VITAMIN D3) 50 MCG (2000 UT) capsule Take 2,000 Units by mouth daily.    . ferrous sulfate 325 (65 FE) MG EC tablet Take 325 mg by mouth daily.    . fluticasone (FLONASE) 50 MCG/ACT nasal spray INSTILL 2 SPRAYS INTO EACH NOSTRIL EVERY DAY 16 g 1  . Multiple Vitamin (MULTIVITAMIN) tablet Take 1 tablet by mouth daily.    . silodosin (RAPAFLO) 8 MG CAPS capsule TAKE ONE CAPSULE BY MOUTH ONE TIME DAILY  30 capsule 0  . alfuzosin (UROXATRAL) 10 MG 24 hr tablet TK 1 T PO HS     No facility-administered medications prior to visit.    Allergies  Allergen Reactions  . Tamsulosin Other (See Comments)    lightheadedness    ROS Review of Systems  Constitutional: Negative for appetite change, chills,  fatigue and fever.  HENT: Positive for postnasal drip. Negative for congestion, dental problem, ear pain, sinus pressure, sinus pain, sore throat and trouble swallowing.   Eyes: Negative for discharge, redness and visual disturbance.  Respiratory: Negative for cough, chest tightness, shortness of breath and wheezing.   Cardiovascular: Negative for chest pain, palpitations and leg swelling.  Gastrointestinal: Negative for abdominal pain, blood in stool, diarrhea, nausea and vomiting.  Endocrine: Negative for cold intolerance, heat intolerance, polydipsia, polyphagia and polyuria.  Genitourinary: Negative for difficulty urinating, dysuria, flank pain, frequency, hematuria and urgency.  Musculoskeletal: Negative for arthralgias, back pain, joint swelling, myalgias and neck stiffness.  Skin: Negative for pallor and rash.  Neurological: Negative for dizziness, speech difficulty, weakness and headaches.  Hematological: Negative for adenopathy. Does not bruise/bleed easily.  Psychiatric/Behavioral: Positive for sleep disturbance. Negative for confusion. The patient is not nervous/anxious.     PE; Blood pressure 119/77, pulse 74, temperature 97.8 F (36.6 C), temperature source Temporal, resp. rate 16, height 5' 9.5" (1.765 m), weight 192 lb 3.2 oz (87.2 kg), SpO2 94 %. Body mass index is 27.98 kg/m.  Gen: Alert, well appearing.  Patient is oriented to person, place, time, and situation. AFFECT: pleasant, lucid thought and speech. ENT: Ears: EACs clear, normal epithelium.  TMs with good light reflex and landmarks bilaterally.  Eyes: no injection, icteris, swelling, or exudate.  EOMI, PERRLA. Nose: no drainage or turbinate edema/swelling.  No injection or focal lesion.  Mouth: lips without lesion/swelling.  Oral mucosa pink and moist.  Dentition intact and without obvious caries or gingival swelling.  Oropharynx without erythema, exudate, or swelling.  Neck: supple/nontender.  No LAD, mass, or TM.   Carotid pulses 2+ bilaterally, without bruits. CV: RRR, no m/r/g.   LUNGS: CTA bilat, nonlabored resps, good aeration in all lung fields. ABD: soft, NT, ND, BS normal.  No hepatospenomegaly or mass.  No bruits. EXT: no clubbing, cyanosis, or edema.  Musculoskeletal: no joint swelling, erythema, warmth, or tenderness.  ROM of all joints intact. Skin - no sores or suspicious lesions or rashes or color changes   Pertinent labs:  Lab Results  Component Value Date   TSH 3.97 04/27/2019   Lab Results  Component Value Date   WBC 3.9 04/27/2019   HGB 15.0 04/27/2019   HCT 44.7 04/27/2019   MCV 96.8 04/27/2019   PLT 307 04/27/2019   Lab Results  Component Value Date   CREATININE 1.10 04/27/2019   BUN 18 04/27/2019   NA  136 04/27/2019   K 4.4 04/27/2019   CL 103 04/27/2019   CO2 28 04/27/2019   Lab Results  Component Value Date   ALT 19 06/09/2018   AST 31 06/09/2018   ALKPHOS 63 06/09/2018   BILITOT 0.6 06/09/2018   Lab Results  Component Value Date   CHOL 226 (H) 06/09/2018   Lab Results  Component Value Date   HDL 70.20 06/09/2018   Lab Results  Component Value Date   LDLCALC 135 (H) 06/09/2018   Lab Results  Component Value Date   TRIG 103.0 06/09/2018   Lab Results  Component Value Date   CHOLHDL 3 06/09/2018   Lab Results  Component Value Date   PSA 7.3 05/04/2019   PSA 9 08/08/2018   PSA 9.05 (H) 06/09/2018   Lab Results  Component Value Date   HGBA1C 5.7 06/09/2018    ASSESSMENT AND PLAN:   1) OSA: suspected. Refer to sleep MD with [facility]  2) Allergic rhinitis: continue daily flonase, add otc non-sedating antihistamine.  3) Health maintenance exam: Reviewed age and gender appropriate health maintenance issues (prudent diet, regular exercise, health risks of tobacco and excessive alcohol, use of seatbelts, fire alarms in home, use of sunscreen).  Also reviewed age and gender appropriate health screening as well as vaccine  recommendations. Vaccines: Td utd.  He will be getting covid 19 vaccine when able. Labs: CMET, CBC Prostate ca screening: hx of elevated PSA, benign prostate bx, followed by urol. Colon ca screening: next colonoscopy due 2022.  An After Visit Summary was printed and given to the patient.  FOLLOW UP:  Return in about 1 year (around 07/12/2020) for annual CPE (fasting).  Signed:  Crissie Sickles, MD           07/13/2019

## 2019-07-13 NOTE — Patient Instructions (Signed)
Try an over the counter antihistamine such as zyrtec, allegra, or claritin (all have generic equivalents) to help with your nasal congestion.  Continue your flonase nasal spray.   Health Maintenance, Male Adopting a healthy lifestyle and getting preventive care are important in promoting health and wellness. Ask your health care provider about:  The right schedule for you to have regular tests and exams.  Things you can do on your own to prevent diseases and keep yourself healthy. What should I know about diet, weight, and exercise? Eat a healthy diet   Eat a diet that includes plenty of vegetables, fruits, low-fat dairy products, and lean protein.  Do not eat a lot of foods that are high in solid fats, added sugars, or sodium. Maintain a healthy weight Body mass index (BMI) is a measurement that can be used to identify possible weight problems. It estimates body fat based on height and weight. Your health care provider can help determine your BMI and help you achieve or maintain a healthy weight. Get regular exercise Get regular exercise. This is one of the most important things you can do for your health. Most adults should:  Exercise for at least 150 minutes each week. The exercise should increase your heart rate and make you sweat (moderate-intensity exercise).  Do strengthening exercises at least twice a week. This is in addition to the moderate-intensity exercise.  Spend less time sitting. Even light physical activity can be beneficial. Watch cholesterol and blood lipids Have your blood tested for lipids and cholesterol at 65 years of age, then have this test every 5 years. You may need to have your cholesterol levels checked more often if:  Your lipid or cholesterol levels are high.  You are older than 65 years of age.  You are at high risk for heart disease. What should I know about cancer screening? Many types of cancers can be detected early and may often be prevented.  Depending on your health history and family history, you may need to have cancer screening at various ages. This may include screening for:  Colorectal cancer.  Prostate cancer.  Skin cancer.  Lung cancer. What should I know about heart disease, diabetes, and high blood pressure? Blood pressure and heart disease  High blood pressure causes heart disease and increases the risk of stroke. This is more likely to develop in people who have high blood pressure readings, are of African descent, or are overweight.  Talk with your health care provider about your target blood pressure readings.  Have your blood pressure checked: ? Every 3-5 years if you are 80-3 years of age. ? Every year if you are 61 years old or older.  If you are between the ages of 19 and 43 and are a current or former smoker, ask your health care provider if you should have a one-time screening for abdominal aortic aneurysm (AAA). Diabetes Have regular diabetes screenings. This checks your fasting blood sugar level. Have the screening done:  Once every three years after age 20 if you are at a normal weight and have a low risk for diabetes.  More often and at a younger age if you are overweight or have a high risk for diabetes. What should I know about preventing infection? Hepatitis B If you have a higher risk for hepatitis B, you should be screened for this virus. Talk with your health care provider to find out if you are at risk for hepatitis B infection. Hepatitis C Blood testing  is recommended for:  Everyone born from [birth year] through 1966.  Anyone with known risk factors for hepatitis C. Sexually transmitted infections (STIs)  You should be screened each year for STIs, including gonorrhea and chlamydia, if: ? You are sexually active and are younger than 65 years of age. ? You are older than 65 years of age and your health care provider tells you that you are at risk for this type of infection. ? Your sexual  activity has changed since you were last screened, and you are at increased risk for chlamydia or gonorrhea. Ask your health care provider if you are at risk.  Ask your health care provider about whether you are at high risk for HIV. Your health care provider may recommend a prescription medicine to help prevent HIV infection. If you choose to take medicine to prevent HIV, you should first get tested for HIV. You should then be tested every 3 months for as long as you are taking the medicine. Follow these instructions at home: Lifestyle  Do not use any products that contain nicotine or tobacco, such as cigarettes, e-cigarettes, and chewing tobacco. If you need help quitting, ask your health care provider.  Do not use street drugs.  Do not share needles.  Ask your health care provider for help if you need support or information about quitting drugs. Alcohol use  Do not drink alcohol if your health care provider tells you not to drink.  If you drink alcohol: ? Limit how much you have to 0-2 drinks a day. ? Be aware of how much alcohol is in your drink. In the [place] one drink equals one 12 oz bottle of beer (355 mL), one 5 oz glass of wine (148 mL), or one 1 oz glass of hard liquor (44 mL). General instructions  Schedule regular health, dental, and eye exams.  Stay current with your vaccines.  Tell your health care provider if: ? You often feel depressed. ? You have ever been abused or do not feel safe at home. Summary  Adopting a healthy lifestyle and getting preventive care are important in promoting health and wellness.  Follow your health care provider's instructions about healthy diet, exercising, and getting tested or screened for diseases.  Follow your health care provider's instructions on monitoring your cholesterol and blood pressure. This information is not intended to replace advice given to you by your health care provider. Make sure you discuss any questions you have  with your health care provider. Document Revised: 03/23/2018 Document Reviewed: 03/23/2018 Elsevier Patient Education  2020 [employer].

## 2019-07-27 ENCOUNTER — Other Ambulatory Visit: Payer: Self-pay

## 2019-07-27 ENCOUNTER — Ambulatory Visit (INDEPENDENT_AMBULATORY_CARE_PROVIDER_SITE_OTHER): Payer: BC Managed Care – PPO | Admitting: Neurology

## 2019-07-27 ENCOUNTER — Encounter: Payer: Self-pay | Admitting: Neurology

## 2019-07-27 VITALS — BP 112/64 | HR 84 | Temp 97.3°F | Ht 69.5 in | Wt 188.0 lb

## 2019-07-27 DIAGNOSIS — R0683 Snoring: Secondary | ICD-10-CM | POA: Diagnosis not present

## 2019-07-27 DIAGNOSIS — G4719 Other hypersomnia: Secondary | ICD-10-CM

## 2019-07-27 DIAGNOSIS — E663 Overweight: Secondary | ICD-10-CM

## 2019-07-27 DIAGNOSIS — R0681 Apnea, not elsewhere classified: Secondary | ICD-10-CM

## 2019-07-27 DIAGNOSIS — R351 Nocturia: Secondary | ICD-10-CM

## 2019-07-27 NOTE — Patient Instructions (Signed)

## 2019-07-27 NOTE — Progress Notes (Signed)
Subjective:    Patient ID: Christopher Banks is a 64 y.o. male.  HPI      Star Age, MD, PhD [facility] [address] [place], [place] [address] Dear Dr. Anitra Lauth,  I saw your patient, Christopher Banks, upon your kind request of a sleep clinic today for initial consultation of his sleep disorder combined particular, concern for underlying obstructive sleep apnea.  The patient is unaccompanied today.  As you know, Christopher Banks is a 64 year old right-handed gentleman with an underlying medical history of hyperlipidemia, elevated TSH, elevated PSA, vertigo, allergic rhinitis, history of Covid diagnosis in December 2020, pulmonary nodule, and mildly overweight state, who reports snoring and excessive daytime somnolence at times as well as witnessed apneas per wife's report.  I reviewed your office note from 07/13/2019.  His Epworth sleepiness score is 10 out of 24, fatigue severity score is 28 out of 63.  He lives with his wife, they have 2 grandchildren and first grandchild on the way.  He is a non-smoker and drinks alcohol in the form of wine, 1 to 2 glasses/day.  He limits his caffeine to 1 cup of coffee per day on average.  He exercises in the mornings.  He goes to bed around 10 and rise time is typically between 5 and 5:45 AM.  He is often awake early.  He has nocturia about 2-3 times per average night and denies recurrent morning headaches.  He has had intermittent vertigo symptoms.  He works as an Chief Financial Officer, currently back in the office.  He has a TV in the bedroom but does not watch it.  They have 2 small dogs in the household, both sleep in the bedroom, one of them typically on the bed with them.  He has no obvious family history of sleep apnea.  He has woken up with a leg cramp but denies any telltale symptoms of restless leg syndrome.   His Past Medical History Is Significant For: Past Medical History:  Diagnosis Date  . Allergic rhinitis    dust  mites  . BPH with obstruction/lower urinary tract symptoms   . BPPV (benign paroxysmal positional vertigo)   . Elevated PSA    Prostate bx 09/2018 NORMAL. PSA dec to 7.16 Apr 2019.  Marland Kitchen Elevated TSH    2018, not taking biotin. 2020, same-->T4/T3 normal.  . Hyperlipidemia    borderline-->meds not recommended in the past. 06/2019 levels same->[facility]  . Pulmonary nodule Jan/feb 2021   5 mm R lung; pt low risk for lung ca so NO F/U IMAGING IS INDICATED.    His Past Surgical History Is Significant For: Past Surgical History:  Procedure Laterality Date  . COLONOSCOPY  2012   Normal  . PROSTATE BIOPSY  09/2018   NORMAL    His Family History Is Significant For: Family History  Problem Relation Age of Onset  . Hearing loss Mother   . Hyperlipidemia Mother   . Irritable bowel syndrome Sister   . Other Brother        elevated PSA  . Diabetes Maternal Grandfather     His Social History Is Significant For: Social History   Socioeconomic History  . Marital status: Married    Spouse name: Not on file  . Number of children: Not on file  . Years of education: Not on file  . Highest education level: Not on file  Occupational History  . Not on file  Tobacco Use  . Smoking status: Never  Smoker  . Smokeless tobacco: Never Used  Substance and Sexual Activity  . Alcohol use: Yes    Comment: occasionally  . Drug use: Never  . Sexual activity: Not on file  Other Topics Concern  . Not on file  Social History Narrative   Married, 2 children.   Orig from [place], relocated to [place].   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   No tob.   Alc: 2 glasses of wine per night.   Social Determinants of Health   Financial Resource Strain:   . Difficulty of Paying Living Expenses:   Food Insecurity:   . Worried About Charity fundraiser in the Last Year:   . Arboriculturist in the Last Year:   Transportation Needs:   . Film/video editor (Medical):   Marland Kitchen Lack of  Transportation (Non-Medical):   Physical Activity:   . Days of Exercise per Week:   . Minutes of Exercise per Session:   Stress:   . Feeling of Stress :   Social Connections:   . Frequency of Communication with Friends and Family:   . Frequency of Social Gatherings with Friends and Family:   . Attends Religious Services:   . Active Member of Clubs or Organizations:   . Attends Archivist Meetings:   Marland Kitchen Marital Status:     His Allergies Are:  Allergies  Allergen Reactions  . Tamsulosin Other (See Comments)    lightheadedness  :   His Current Medications Are:  Outpatient Encounter Medications as of 07/27/2019  Medication Sig  . Cholecalciferol (VITAMIN D3) 50 MCG (2000 UT) capsule Take 2,000 Units by mouth daily.  . ferrous sulfate 325 (65 FE) MG EC tablet Take 325 mg by mouth daily.  . fluticasone (FLONASE) 50 MCG/ACT nasal spray INSTILL 2 SPRAYS INTO EACH NOSTRIL EVERY DAY  . Multiple Vitamin (MULTIVITAMIN) tablet Take 1 tablet by mouth daily.  . silodosin (RAPAFLO) 8 MG CAPS capsule TAKE ONE CAPSULE BY MOUTH ONE TIME DAILY    No facility-administered encounter medications on file as of 07/27/2019.  :  Review of Systems:  Out of a complete 14 point review of systems, all are reviewed and negative with the exception of these symptoms as listed below: Review of Systems  Neurological:       Pt presents today to discuss his sleep. Pt has never had a sleep study but does endorse snoring.  Epworth Sleepiness Scale 0= would never doze 1= slight chance of dozing 2= moderate chance of dozing 3= high chance of dozing  Sitting and reading: 2 Watching TV: 3 Sitting inactive in a public place (ex. Theater or meeting): 1 As a passenger in a car for an hour without a break: 1 Lying down to rest in the afternoon: 2 Sitting and talking to someone: 0 Sitting quietly after lunch (no alcohol): 1 In a car, while stopped in traffic: 0 Total: 10    Objective:  Neurological  Exam  Physical Exam Physical Examination:   Vitals:   07/27/19 0744  BP: 112/64  Pulse: 84  Temp: (!) 97.3 F (36.3 C)    General Examination: The patient is a very pleasant 64 y.o. male in no acute distress. He appears well-developed and well-nourished and well groomed.   HEENT: Normocephalic, atraumatic, pupils are equal, round and reactive to light, extraocular tracking is good without limitation to gaze excursion or nystagmus noted. Hearing is grossly intact. Face is symmetric  with normal facial animation. Speech is clear with no dysarthria noted. There is no hypophonia. There is no lip, neck/head, jaw or voice tremor. Neck is supple with full range of passive and active motion. There are no carotid bruits on auscultation. Oropharynx exam reveals: mild mouth dryness, adequate dental hygiene and moderate airway crowding, due to redundant soft palate, Mallampati class III, tonsils on the smaller side but not fully visualized, uvula not fully visualized.  Tongue protrudes centrally in palate elevates symmetrically.  Neck circumference is 16-5/8 inches.  He has a mild overbite.  Chest: Clear to auscultation without wheezing, rhonchi or crackles noted.  Heart: S1+S2+0, regular and normal without murmurs, rubs or gallops noted.   Abdomen: Soft, non-tender and non-distended with normal bowel sounds appreciated on auscultation.  Extremities: There is no pitting edema in the distal lower extremities bilaterally.   Skin: Warm and dry without trophic changes noted.   Musculoskeletal: exam reveals no obvious joint deformities, tenderness or joint swelling or erythema.   Neurologically:  Mental status: The patient is awake, alert and oriented in all 4 spheres. His immediate and remote memory, attention, language skills and fund of knowledge are appropriate. There is no evidence of aphasia, agnosia, apraxia or anomia. Speech is clear with normal prosody and enunciation. Thought process is linear.  Mood is normal and affect is normal.  Cranial nerves II - XII are as described above under HEENT exam.  Motor exam: Normal bulk, strength and tone is noted. There is no tremor, Romberg is negative with slight sway noted. Fine motor skills and coordination: grossly intact.  Cerebellar testing: No dysmetria or intention tremor. There is no truncal or gait ataxia.  Sensory exam: intact to light touch in the upper and lower extremities.  Gait, station and balance: He stands easily. No veering to one side is noted. No leaning to one side is noted. Posture is age-appropriate and stance is narrow based. Gait shows normal stride length and normal pace. No problems turning are noted. Tandem walk is difficult for him.                Assessment and Plan:  In summary, Christopher Banks is a very pleasant 64 y.o.-year old male with an underlying medical history of hyperlipidemia, elevated TSH, elevated PSA, vertigo, allergic rhinitis, history of Covid diagnosis in December 2020, pulmonary nodule, and mildly overweight state, whose history and physical exam are concerning for obstructive sleep apnea (OSA). I had a long chat with the patient about my findings and the diagnosis of OSA, its prognosis and treatment options. We talked about medical treatments, surgical interventions and non-pharmacological approaches. I explained in particular the risks and ramifications of untreated moderate to severe OSA, especially with respect to developing cardiovascular disease down the Road, including congestive heart failure, difficult to treat hypertension, cardiac arrhythmias, or stroke. Even type 2 diabetes has, in part, been linked to untreated OSA. Symptoms of untreated OSA include daytime sleepiness, memory problems, mood irritability and mood disorder such as depression and anxiety, lack of energy, as well as recurrent headaches, especially morning headaches. We talked about trying to maintain a healthy lifestyle in general, as  well as the importance of weight control. We also talked about the importance of good sleep hygiene. I recommended the following at this time: sleep study.  I explained the sleep test procedure to the patient and also outlined possible surgical and non-surgical treatment options of OSA, including the use of a custom-made dental device (which would require  a referral to a specialist dentist or oral surgeon), upper airway surgical options, such as traditional UPPP or a novel less invasive surgical option in the form of Inspire hypoglossal nerve stimulation (which would involve a referral to an ENT surgeon). I also explained the CPAP treatment option to the patient, who indicated that he would be willing to try CPAP if the need arises. I explained the importance of being compliant with PAP treatment, not only for insurance purposes but primarily to improve His symptoms, and for the patient's long term health benefit, including to reduce His cardiovascular risks. I answered all his questions today and the patient was in agreement. I plan to see him back after the sleep study is completed and encouraged him to call with any interim questions, concerns, problems or updates.   Thank you very much for allowing me to participate in the care of this nice patient. If I can be of any further assistance to you please do not hesitate to call me at [phone].  Sincerely,   Star Age, MD, PhD

## 2019-08-12 DIAGNOSIS — G4733 Obstructive sleep apnea (adult) (pediatric): Secondary | ICD-10-CM

## 2019-08-12 HISTORY — DX: Obstructive sleep apnea (adult) (pediatric): G47.33

## 2019-08-23 ENCOUNTER — Ambulatory Visit (INDEPENDENT_AMBULATORY_CARE_PROVIDER_SITE_OTHER): Payer: BC Managed Care – PPO | Admitting: Neurology

## 2019-08-23 DIAGNOSIS — R0683 Snoring: Secondary | ICD-10-CM

## 2019-08-23 DIAGNOSIS — R351 Nocturia: Secondary | ICD-10-CM

## 2019-08-23 DIAGNOSIS — G4733 Obstructive sleep apnea (adult) (pediatric): Secondary | ICD-10-CM

## 2019-08-23 DIAGNOSIS — E663 Overweight: Secondary | ICD-10-CM

## 2019-08-23 DIAGNOSIS — G4719 Other hypersomnia: Secondary | ICD-10-CM

## 2019-08-23 DIAGNOSIS — R0681 Apnea, not elsewhere classified: Secondary | ICD-10-CM

## 2019-08-25 HISTORY — PX: OTHER SURGICAL HISTORY: SHX169

## 2019-08-25 NOTE — Progress Notes (Signed)
Patient referred by Dr. Anitra Lauth, seen by me on 07/27/19, HST on 08/23/19.    Please call and notify the patient that the recent home sleep test showed obstructive sleep apnea in the severe range. While I recommend treatment for this in the form CPAP, his insurance will not approve a sleep study for this. They will likely only approve a trial of autoPAP, which means, that we don't have to bring him in for a sleep study with CPAP, but will let him start using a so called autoPAP machine at home, through a DME company (of his choice, or as per insurance requirement). The DME representative will educate him on how to use the machine, how to put the mask on, etc. I have placed an order in the chart. Please send referral, talk to patient, send report to referring MD. We will need a FU in sleep clinic for 10 weeks post-PAP set up, please arrange that with me or one of our NPs. Thanks,   Star Age, MD, PhD [facility])

## 2019-08-25 NOTE — Procedures (Signed)
Patient Information     First Name: Christopher Last Name: Banks ID: [id number]  Birth Date: 1955-11-26 Age: 64 Gender: Male  Referring Provider: Tammi Sou, MD BMI: 26.8 (W=187 lb, H=5' 10'')  Neck Circ.:  17 '' Epworth:  10/24   Sleep Study Information    Study Date: Aug 23, 2019 S/H/A Version: [id number] / 4.0.1515 / 38  History:    64 year old man with a history of hyperlipidemia, elevated TSH, elevated PSA, vertigo, allergic rhinitis, history of Covid diagnosis in December 2020, pulmonary nodule, and mildly overweight state, who reports snoring and excessive daytime somnolence at times as well as witnessed apneas per wife's report. Summary & Diagnosis:     OSA Recommendations:     This home sleep test demonstrates severe obstructive sleep apnea with a total AHI of 39.1/hour and O2 nadir of 78%. Treatment with positive airway pressure (in the form of CPAP) is recommended. This will require a full night CPAP titration study for proper treatment settings, O2 monitoring and mask fitting. Based on the severity of the sleep disordered breathing an attended titration study is indicated. However, patient's insurance has denied an attended sleep study; therefore, the patient will be advised to proceed with an autoPAP titration/trial at home for now. Please note that untreated obstructive sleep apnea may carry additional perioperative morbidity. Patients with significant obstructive sleep apnea should receive perioperative PAP therapy and the surgeons and particularly the anesthesiologist should be informed of the diagnosis and the severity of the sleep disordered breathing. The patient should be cautioned not to drive, work at heights, or operate dangerous or heavy equipment when tired or sleepy. Review and reiteration of good sleep hygiene measures should be pursued with any patient. Other causes of the patient's symptoms, including circadian rhythm disturbances, an underlying mood disorder,  medication effect and/or an underlying medical problem cannot be ruled out based on this test. Clinical correlation is recommended. The patient and his referring provider will be notified of the test results. The patient will be seen in follow up in sleep clinic at [facility].  I certify that I have reviewed the raw data recording prior to the issuance of this report in accordance with the standards of the American Academy of Sleep Medicine (AASM).  Star Age, MD, PhD [facility]) Diplomat, ABPN (Neurology and Sleep)             Sleep Summary    Oxygen Saturation Statistics     Start Study Time: End Study Time: Total Recording Time:          10:36:49 PM 5:51:09 AM   7 h, 14 min  Total Sleep Time % REM of Sleep Time:  6 h, 17 min  7.8    Mean: 91 Minimum: 78 Maximum: 98  Mean of Desaturations Nadirs (%):   88  Oxygen Desaturation. %:   4-9 10-20 >20 Total  Events Number Total   168  17 90.8 9.2  0 0.0  185 100.0  Oxygen Saturation: <90 <=88 <85 <80 <70  Duration (minutes): Sleep % 42.3 11.2 24.0 4.0 6.3 1.1 0.1 0.0 0.0 0.0     Respiratory Indices      Total Events REM NREM All Night  pRDI:  248  pAHI:  244 ODI:  185  pAHIc:  33  % CSR: 0.0 N/A N/A N/A N/A N/A N/A N/A N/A 39.7 39.1 29.6 5.3       Pulse Rate Statistics during Sleep (BPM)  Mean: 75 Minimum: 52 Maximum: 109    Indices are calculated using technically valid sleep time of 6 h, 14 min.  pRDI/pAHI are calculated using oxi desaturations ? 3% REM/NREM indices appear only if REM time >  30 min.  Body Position Statistics  Position Supine Prone Right Left Non-Supine  Sleep (min) 337.3 15.0 0.0 25.5 40.5  Sleep % 89.3 4.0 0.0 6.7 10.7  pRDI 40.5 32.0 N/A 33.3 32.8  pAHI 40.0 32.0 N/A 30.9 31.3  ODI 31.6 16.0 N/A 11.9 13.4     Snoring Statistics Snoring Level (dB) >40 >50 >60 >70 >80 >Threshold (45)  Sleep (min) 162.0 41.0 4.1 1.9 0.0 85.9  Sleep % 42.9  10.8 1.1 0.5 0.0 22.7    Mean: 43 dB Sleep Stages Chart                                                           pAHI=39.1                                                                       Mild              Moderate                    Severe                                                 5              15                    30

## 2019-08-25 NOTE — Addendum Note (Signed)
Addended by: Star Age on: 08/25/2019 03:25 PM   Modules accepted: Orders

## 2019-08-28 ENCOUNTER — Encounter: Payer: Self-pay | Admitting: Family Medicine

## 2019-08-28 ENCOUNTER — Telehealth: Payer: Self-pay

## 2019-08-28 NOTE — Telephone Encounter (Signed)
-----   Message from Star Age, MD sent at 08/25/2019  3:25 PM EDT ----- Patient referred by Dr. Anitra Lauth, seen by me on 07/27/19, HST on 08/23/19.    Please call and notify the patient that the recent home sleep test showed obstructive sleep apnea in the severe range. While I recommend treatment for this in the form CPAP, his insurance will not approve a sleep study for this. They will likely only approve a trial of autoPAP, which means, that we don't have to bring him in for a sleep study with CPAP, but will let him start using a so called autoPAP machine at home, through a DME company (of his choice, or as per insurance requirement). The DME representative will educate him on how to use the machine, how to put the mask on, etc. I have placed an order in the chart. Please send referral, talk to patient, send report to referring MD. We will need a FU in sleep clinic for 10 weeks post-PAP set up, please arrange that with me or one of our NPs. Thanks,   Star Age, MD, PhD [facility])

## 2019-08-28 NOTE — Telephone Encounter (Signed)
I called pt. I advised pt that Dr. Rexene Alberts reviewed their sleep study results and found that pt has severe osa. Dr. Rexene Alberts recommends that pt start an auto pap at home. I reviewed PAP compliance expectations with the pt. Pt is agreeable to starting an auto-PAP. I advised pt that an order will be sent to a DME, [facility], and [facility] will call the pt within about one week after they file with the pt's insurance. [facility] will show the pt how to use the machine, fit for masks, and troubleshoot the auto-PAP if needed. A follow up appt was made for insurance purposes with Amy, NP on 11/21/19 at 8:00am. Pt verbalized understanding to arrive 15 minutes early and bring their auto-PAP. A letter with all of this information in it will be mailed to the pt as a reminder. I verified with the pt that the address we have on file is correct. Pt verbalized understanding of results. Pt had no questions at this time but was encouraged to call back if questions arise. I have sent the order to [facility] and have received confirmation that they have received the order.

## 2019-09-08 DIAGNOSIS — G4733 Obstructive sleep apnea (adult) (pediatric): Secondary | ICD-10-CM | POA: Diagnosis not present

## 2019-10-09 DIAGNOSIS — G4733 Obstructive sleep apnea (adult) (pediatric): Secondary | ICD-10-CM | POA: Diagnosis not present

## 2019-10-24 DIAGNOSIS — R351 Nocturia: Secondary | ICD-10-CM | POA: Diagnosis not present

## 2019-10-24 DIAGNOSIS — N401 Enlarged prostate with lower urinary tract symptoms: Secondary | ICD-10-CM | POA: Diagnosis not present

## 2019-10-24 LAB — PSA: PSA: 5.03

## 2019-11-06 DIAGNOSIS — R351 Nocturia: Secondary | ICD-10-CM | POA: Diagnosis not present

## 2019-11-06 DIAGNOSIS — N401 Enlarged prostate with lower urinary tract symptoms: Secondary | ICD-10-CM | POA: Diagnosis not present

## 2019-11-06 DIAGNOSIS — R972 Elevated prostate specific antigen [PSA]: Secondary | ICD-10-CM | POA: Diagnosis not present

## 2019-11-07 ENCOUNTER — Encounter: Payer: Self-pay | Admitting: Family Medicine

## 2019-11-08 DIAGNOSIS — G4733 Obstructive sleep apnea (adult) (pediatric): Secondary | ICD-10-CM | POA: Diagnosis not present

## 2019-11-13 ENCOUNTER — Encounter: Payer: Self-pay | Admitting: Family Medicine

## 2019-11-20 NOTE — Patient Instructions (Signed)
Please continue using your CPAP regularly. While your insurance requires that you use CPAP at least 4 hours each night on 70% of the nights, I recommend, that you not skip any nights and use it throughout the night if you can. Getting used to CPAP and staying with the treatment long term does take time and patience and discipline. Untreated obstructive sleep apnea when it is moderate to severe can have an adverse impact on cardiovascular health and raise her risk for heart disease, arrhythmias, hypertension, congestive heart failure, stroke and diabetes. Untreated obstructive sleep apnea causes sleep disruption, nonrestorative sleep, and sleep deprivation. This can have an impact on your day to day functioning and cause daytime sleepiness and impairment of cognitive function, memory loss, mood disturbance, and problems focussing. Using CPAP regularly can improve these symptoms.   Follow up in 6 months    Sleep Apnea Sleep apnea affects breathing during sleep. It causes breathing to stop for a short time or to become shallow. It can also increase the risk of:  Heart attack.  Stroke.  Being very overweight (obese).  Diabetes.  Heart failure.  Irregular heartbeat. The goal of treatment is to help you breathe normally again. What are the causes? There are three kinds of sleep apnea:  Obstructive sleep apnea. This is caused by a blocked or collapsed airway.  Central sleep apnea. This happens when the brain does not send the right signals to the muscles that control breathing.  Mixed sleep apnea. This is a combination of obstructive and central sleep apnea. The most common cause of this condition is a collapsed or blocked airway. This can happen if:  Your throat muscles are too relaxed.  Your tongue and tonsils are too large.  You are overweight.  Your airway is too small. What increases the risk?  Being overweight.  Smoking.  Having a small airway.  Being older.  Being  male.  Drinking alcohol.  Taking medicines to calm yourself (sedatives or tranquilizers).  Having family members with the condition. What are the signs or symptoms?  Trouble staying asleep.  Being sleepy or tired during the day.  Getting angry a lot.  Loud snoring.  Headaches in the morning.  Not being able to focus your mind (concentrate).  Forgetting things.  Less interest in sex.  Mood swings.  Personality changes.  Feelings of sadness (depression).  Waking up a lot during the night to pee (urinate).  Dry mouth.  Sore throat. How is this diagnosed?  Your medical history.  A physical exam.  A test that is done when you are sleeping (sleep study). The test is most often done in a sleep lab but may also be done at home. How is this treated?   Sleeping on your side.  Using a medicine to get rid of mucus in your nose (decongestant).  Avoiding the use of alcohol, medicines to help you relax, or certain pain medicines (narcotics).  Losing weight, if needed.  Changing your diet.  Not smoking.  Using a machine to open your airway while you sleep, such as: ? An oral appliance. This is a mouthpiece that shifts your lower jaw forward. ? A CPAP device. This device blows air through a mask when you breathe out (exhale). ? An EPAP device. This has valves that you put in each nostril. ? A BPAP device. This device blows air through a mask when you breathe in (inhale) and breathe out.  Having surgery if other treatments do not work. It   is important to get treatment for sleep apnea. Without treatment, it can lead to:  High blood pressure.  Coronary artery disease.  In men, not being able to have an erection (impotence).  Reduced thinking ability. Follow these instructions at home: Lifestyle  Make changes that your doctor recommends.  Eat a healthy diet.  Lose weight if needed.  Avoid alcohol, medicines to help you relax, and some pain  medicines.  Do not use any products that contain nicotine or tobacco, such as cigarettes, e-cigarettes, and chewing tobacco. If you need help quitting, ask your doctor. General instructions  Take over-the-counter and prescription medicines only as told by your doctor.  If you were given a machine to use while you sleep, use it only as told by your doctor.  If you are having surgery, make sure to tell your doctor you have sleep apnea. You may need to bring your device with you.  Keep all follow-up visits as told by your doctor. This is important. Contact a doctor if:  The machine that you were given to use during sleep bothers you or does not seem to be working.  You do not get better.  You get worse. Get help right away if:  Your chest hurts.  You have trouble breathing in enough air.  You have an uncomfortable feeling in your back, arms, or stomach.  You have trouble talking.  One side of your body feels weak.  A part of your face is hanging down. These symptoms may be an emergency. Do not wait to see if the symptoms will go away. Get medical help right away. Call your local emergency services (911 in the [place] Do not drive yourself to the hospital. Summary  This condition affects breathing during sleep.  The most common cause is a collapsed or blocked airway.  The goal of treatment is to help you breathe normally while you sleep. This information is not intended to replace advice given to you by your health care provider. Make sure you discuss any questions you have with your health care provider. Document Revised: 01/14/2018 Document Reviewed: 11/23/2017 Elsevier Patient Education  2020 Elsevier Inc.  

## 2019-11-20 NOTE — Progress Notes (Addendum)
PATIENT: Christopher Banks DOB: Apr 23, 1955  REASON FOR VISIT: follow up HISTORY FROM: patient  Chief Complaint  Patient presents with  . Follow-up    F/U on cpap. States he is still getting used to machine  . room 6    alone     HISTORY OF PRESENT ILLNESS: Today 11/21/19 Christopher Banks is a 64 y.o. male here today for follow up for recently diagnosed OSA started on CPAP therapy. HST on 5/12 showed "severe obstructive sleep apnea with a total AHI of 39.1/hour and O2 nadir of 78%". He reports that he is adjusting to therapy. He is using a full face mask and feels that he is doing fairly well. He does note a leak from time to time but has been monitoring this at home. He does not sleep well. He is restless. He exercises every morning. He is getting about 7 hours of sleep nightly.   Compliance report dated 10/21/2019 through 11/19/2019 reveals that he has used CPAP therapy 30 of the past 30 days for compliance of 100%. He is CPAP greater than 4 hours 29 in the past 30 days for compliance of 97%. Average usage was 7 hours and 25 minutes. Residual AHI was 3.9 on 7 to 14 cm of water and EPR 1. There was a leak noted in the 95th percentile of 23.5 L/min.  HISTORY: (copied from Dr Guadelupe Sabin note on 07/27/2019)  Dear Dr. Anitra Banks,  I saw your patient, Christopher Banks, upon your kind request of a sleep clinic today for initial consultation of his sleep disorder combined particular, concern for underlying obstructive sleep apnea.  The patient is unaccompanied today.  As you know, Mr. Christopher Banks is a 64 year old right-handed gentleman with an underlying medical history of hyperlipidemia, elevated TSH, elevated PSA, vertigo, allergic rhinitis, history of Covid diagnosis in December 2020, pulmonary nodule, and mildly overweight state, who reports snoring and excessive daytime somnolence at times as well as witnessed apneas per wife's report.  I reviewed your office note from 07/13/2019.  His Epworth sleepiness  score is 10 out of 24, fatigue severity score is 28 out of 63.  He lives with his wife, they have 2 grandchildren and first grandchild on the way.  He is a non-smoker and drinks alcohol in the form of wine, 1 to 2 glasses/day.  He limits his caffeine to 1 cup of coffee per day on average.  He exercises in the mornings.  He goes to bed around 10 and rise time is typically between 5 and 5:45 AM.  He is often awake early.  He has nocturia about 2-3 times per average night and denies recurrent morning headaches.  He has had intermittent vertigo symptoms.  He works as an Chief Financial Officer, currently back in the office.  He has a TV in the bedroom but does not watch it.  They have 2 small dogs in the household, both sleep in the bedroom, one of them typically on the bed with them.  He has no obvious family history of sleep apnea.  He has woken up with a leg cramp but denies any telltale symptoms of restless leg syndrome.   REVIEW OF SYSTEMS: Out of a complete 14 system review of symptoms, the patient complains only of the following symptoms, restless sleep and all other reviewed systems are negative.  ESS:8 FSS:22  ALLERGIES: Allergies  Allergen Reactions  . Tamsulosin Other (See Comments)    lightheadedness    HOME MEDICATIONS: Outpatient Medications Prior to Visit  Medication  Sig Dispense Refill  . Cholecalciferol (VITAMIN D3) 50 MCG (2000 UT) capsule Take 2,000 Units by mouth daily.    . ferrous sulfate 325 (65 FE) MG EC tablet Take 325 mg by mouth daily.    . fluticasone (FLONASE) 50 MCG/ACT nasal spray INSTILL 2 SPRAYS INTO EACH NOSTRIL EVERY DAY 16 g 1  . Multiple Vitamin (MULTIVITAMIN) tablet Take 1 tablet by mouth daily.    . silodosin (RAPAFLO) 8 MG CAPS capsule TAKE ONE CAPSULE BY MOUTH ONE TIME DAILY  30 capsule 0   No facility-administered medications prior to visit.    PAST MEDICAL HISTORY: Past Medical History:  Diagnosis Date  . Allergic rhinitis    dust mites  . BPH with  obstruction/lower urinary tract symptoms   . BPPV (benign paroxysmal positional vertigo)   . Elevated PSA    Prostate bx 09/2018 NORMAL. PSA dec to 7.16 Apr 2019, then 5.14 October 2019.  Marland Kitchen Elevated TSH    2018, not taking biotin. 2020, same-->T4/T3 normal.  . Hyperlipidemia    borderline-->meds not recommended in the past. 06/2019 levels same->TLC  . OSA (obstructive sleep apnea) 08/2019   sleep study->severe OSA->CPAP recommended  . Pulmonary nodule Jan/feb 2021   5 mm R lung; pt low risk for lung ca so NO F/U IMAGING IS INDICATED.    PAST SURGICAL HISTORY: Past Surgical History:  Procedure Laterality Date  . COLONOSCOPY  2012   Normal  . polysomnogram  08/25/2019   severe OSA->CPAP recommended  . PROSTATE BIOPSY  09/2018   NORMAL    FAMILY HISTORY: Family History  Problem Relation Age of Onset  . Hearing loss Mother   . Hyperlipidemia Mother   . Irritable bowel syndrome Sister   . Other Brother        elevated PSA  . Diabetes Maternal Grandfather     SOCIAL HISTORY: Social History   Socioeconomic History  . Marital status: Married    Spouse name: Not on file  . Number of children: Not on file  . Years of education: Not on file  . Highest education level: Not on file  Occupational History  . Not on file  Tobacco Use  . Smoking status: Never Smoker  . Smokeless tobacco: Never Used  Vaping Use  . Vaping Use: Never used  Substance and Sexual Activity  . Alcohol use: Yes    Comment: occasionally  . Drug use: Never  . Sexual activity: Not on file  Other Topics Concern  . Not on file  Social History Narrative   Married, 2 children.   Orig from [place], relocated to [place].   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   No tob.   Alc: 2 glasses of wine per night.   Social Determinants of Health   Financial Resource Strain:   . Difficulty of Paying Living Expenses:   Food Insecurity:   . Worried About Charity fundraiser in the Last Year:    . Arboriculturist in the Last Year:   Transportation Needs:   . Film/video editor (Medical):   Marland Kitchen Lack of Transportation (Non-Medical):   Physical Activity:   . Days of Exercise per Week:   . Minutes of Exercise per Session:   Stress:   . Feeling of Stress :   Social Connections:   . Frequency of Communication with Friends and Family:   . Frequency of Social Gatherings with Friends and Family:   .  Attends Religious Services:   . Active Member of Clubs or Organizations:   . Attends Archivist Meetings:   Marland Kitchen Marital Status:   Intimate Partner Violence:   . Fear of Current or Ex-Partner:   . Emotionally Abused:   Marland Kitchen Physically Abused:   . Sexually Abused:       PHYSICAL EXAM  Vitals:   11/21/19 0758  BP: 110/65  Pulse: 84  Weight: 192 lb 9.6 oz (87.4 kg)  Height: 5' 9.5" (1.765 m)   Body mass index is 28.03 kg/m.  Generalized: Well developed, in no acute distress  Cardiology: normal rate and rhythm, no murmur noted Respiratory: clear to auscultation bilaterally  Neurological examination  Mentation: Alert oriented to time, place, history taking. Follows all commands speech and language fluent Cranial nerve II-XII: Pupils were equal round reactive to light. Extraocular movements were full, visual field were full  Motor: The motor testing reveals 5 over 5 strength of all 4 extremities. Good symmetric motor tone is noted throughout.  Gait and station: Gait is normal.    DIAGNOSTIC DATA (LABS, IMAGING, TESTING) - I reviewed patient records, labs, notes, testing and imaging myself where available.  No flowsheet data found.   Lab Results  Component Value Date   WBC 3.9 04/27/2019   HGB 15.0 04/27/2019   HCT 44.7 04/27/2019   MCV 96.8 04/27/2019   PLT 307 04/27/2019      Component Value Date/Time   NA 134 (L) 07/13/2019 0855   NA 140 06/12/2016 0000   K 4.6 07/13/2019 0855   CL 102 07/13/2019 0855   CO2 29 07/13/2019 0855   GLUCOSE 108 (H)  07/13/2019 0855   BUN 16 07/13/2019 0855   BUN 16 06/12/2016 0000   CREATININE 1.21 07/13/2019 0855   CREATININE 1.10 04/27/2019 0846   CALCIUM 9.7 07/13/2019 0855   PROT 6.7 07/13/2019 0855   ALBUMIN 4.3 07/13/2019 0855   AST 30 07/13/2019 0855   ALT 20 07/13/2019 0855   ALKPHOS 49 07/13/2019 0855   BILITOT 0.6 07/13/2019 0855   Lab Results  Component Value Date   CHOL 220 (H) 07/13/2019   HDL 73.30 07/13/2019   LDLCALC 136 (H) 07/13/2019   TRIG 55.0 07/13/2019   CHOLHDL 3 07/13/2019   Lab Results  Component Value Date   HGBA1C 5.5 07/13/2019   Lab Results  Component Value Date   VITAMINB12 746 06/24/2016   Lab Results  Component Value Date   TSH 3.97 04/27/2019       ASSESSMENT AND PLAN 64 y.o. year old male  has a past medical history of Allergic rhinitis, BPH with obstruction/lower urinary tract symptoms, BPPV (benign paroxysmal positional vertigo), Elevated PSA, Elevated TSH, Hyperlipidemia, OSA (obstructive sleep apnea) (08/2019), and Pulmonary nodule (Jan/feb 2021). here with     ICD-10-CM   1. OSA on CPAP  G47.33    Z99.89     Keats is doing well on CPAP therapy. Compliance report reveals excellent compliance. He was encouraged to continue using CPAP nightly and for greater than 4 hours each night. He will continue to monitor for a leak at home. May consider mask refitting if needed. I have suggested he try melatonin 3-5mg  at night to see if this helps. He will follow-up in 6 months, sooner if needed. He verbalizes understanding and agreement with this plan.   No orders of the defined types were placed in this encounter.    No orders of the defined types were placed in  this encounter.     I spent 15 minutes with the patient. 50% of this time was spent counseling and educating patient on plan of care and medications.    Debbora Presto, FNP-C 11/21/2019, 9:26 AM [facility] [address], [place], [place] [address] [phone]  I reviewed the above note and documentation by the Nurse Practitioner and agree with the history, exam, assessment and plan as outlined above. I was available for consultation. Star Age, MD, PhD [facility])

## 2019-11-21 ENCOUNTER — Ambulatory Visit (INDEPENDENT_AMBULATORY_CARE_PROVIDER_SITE_OTHER): Payer: BC Managed Care – PPO | Admitting: Family Medicine

## 2019-11-21 ENCOUNTER — Encounter: Payer: Self-pay | Admitting: Family Medicine

## 2019-11-21 ENCOUNTER — Other Ambulatory Visit: Payer: Self-pay

## 2019-11-21 VITALS — BP 110/65 | HR 84 | Ht 69.5 in | Wt 192.6 lb

## 2019-11-21 DIAGNOSIS — Z9989 Dependence on other enabling machines and devices: Secondary | ICD-10-CM | POA: Diagnosis not present

## 2019-11-21 DIAGNOSIS — G4733 Obstructive sleep apnea (adult) (pediatric): Secondary | ICD-10-CM | POA: Diagnosis not present

## 2019-12-09 DIAGNOSIS — G4733 Obstructive sleep apnea (adult) (pediatric): Secondary | ICD-10-CM | POA: Diagnosis not present

## 2019-12-29 DIAGNOSIS — G4733 Obstructive sleep apnea (adult) (pediatric): Secondary | ICD-10-CM | POA: Diagnosis not present

## 2020-01-11 ENCOUNTER — Encounter: Payer: Self-pay | Admitting: Family Medicine

## 2020-01-11 ENCOUNTER — Telehealth: Payer: Self-pay

## 2020-01-11 NOTE — Telephone Encounter (Signed)
Noted  

## 2020-01-11 NOTE — Telephone Encounter (Signed)
FYI  Please see below

## 2020-01-11 NOTE — Telephone Encounter (Signed)
Patient has an appointment on Monday;  Nurse Assessment Nurse: Durene Cal, RN, Brandi Date/Time Eilene Ghazi Time): 01/11/2020 1:39:15 PM Confirm and document reason for call. If symptomatic, describe symptoms. ---Caller reports heart rate: 90. Reports it jumps from 70-90 rapidly. Reports he has experienced chest tightness at times, but non at this time. No fever. Does the patient have any new or worsening symptoms? ---Yes Will a triage be completed? ---Yes Related visit to physician within the last 2 weeks? ---No Does the PT have any chronic conditions? (i.e. diabetes, asthma, this includes High risk factors for pregnancy, etc.) ---Yes List chronic conditions. ---Sleep apnea Is this a behavioral health or substance abuse call? ---No Guidelines Guideline Title Affirmed Question Affirmed Notes Nurse Date/Time (Eastern Time) Heart Rate and Heartbeat Questions Age > 60 years (Exception: brief heartbeat symptoms that went away and now feels well) Durene Cal, RN, Brandi 01/11/2020 1:41:51 PM Disp. Time Eilene Ghazi Time) Disposition Final User 01/11/2020 1:35:17 PM Send to Urgent Pablo Ledger, BradiPLEASE NOTE: All timestamps contained within this report are represented as [place] Standard Time. CONFIDENTIALTY NOTICE: This fax transmission is intended only for the addressee. It contains information that is legally privileged, confidential or otherwise protected from use or disclosure. If you are not the intended recipient, you are strictly prohibited from reviewing, disclosing, copying using or disseminating any of this information or taking any action in reliance on or regarding this information. If you have received this fax in error, please notify us immediately by telephone so that we can arrange for its return to [place]. Phone: [phone], Toll-Free: [phone], Fax: [phone] Page: 2 of 2 Call Id: [id number] 01/11/2020 1:51:20 PM See HCP within 4 Hours (or PCP triage) Yes Durene Cal, RN,  Berdie Ogren Disagree/Comply Disagree Caller Understands Yes PreDisposition [place] Advice Given Per Guideline SEE HCP (OR PCP TRIAGE) WITHIN 4 HOURS: CALL BACK IF: * You become worse Comments User: Ermalene Postin, RN Date/Time (Eastern Time): 01/11/2020 1:48:29 PM Monday appt:late morning Caller reports he is leaving to go out of town in the next 3 hours. User: Ermalene Postin, RN Date/Time Eilene Ghazi Time): 01/11/2020 1:51:01 PM Covid positive late December of 2020 and since then has noticed that his heart rates have been different. Referrals REFERRED TO PCP OFFICE GO TO FACILITY UNDECIDE

## 2020-01-15 ENCOUNTER — Other Ambulatory Visit: Payer: Self-pay

## 2020-01-15 ENCOUNTER — Encounter: Payer: Self-pay | Admitting: Family Medicine

## 2020-01-15 ENCOUNTER — Ambulatory Visit (INDEPENDENT_AMBULATORY_CARE_PROVIDER_SITE_OTHER): Payer: BC Managed Care – PPO | Admitting: Family Medicine

## 2020-01-15 VITALS — BP 121/76 | HR 57 | Temp 97.6°F | Resp 16 | Ht 69.5 in | Wt 189.4 lb

## 2020-01-15 DIAGNOSIS — Z23 Encounter for immunization: Secondary | ICD-10-CM | POA: Diagnosis not present

## 2020-01-15 DIAGNOSIS — R009 Unspecified abnormalities of heart beat: Secondary | ICD-10-CM | POA: Diagnosis not present

## 2020-01-15 NOTE — Progress Notes (Signed)
OFFICE VISIT  01/15/2020  CC:  Chief Complaint  Patient presents with  . Elevated HR    last heart rate was taken at 10am at work, 50 bpr   HPI:    Patient is a 64 y.o. Caucasian male who presents for heart rate variability that has him concerned. He feels well, has no palpitations, sense of heart racing, dizziness, or generalized weakness/fatigue.   He simply notes that prior to having covid late last year his heart rate was consistently in the 50s when it was checked at MD/dentist etc. He did no home monitoring prior to having covid. During and since his covid illness he began monitoring his heart rate and oxygen sat very regularly, several times per day. He has noted HR anywhere from mid 50s to 105 range when he checks it, and it has him perplexed b/c w/out activity his HR doesn't seem to be consistent anymore. He has had no fevers, cough, SOB, DOE, or chest pain. He takes no decongestant med and drinks 1 cup coffee as he has done most of his life.  He does do CV workouts regularly.   Past Medical History:  Diagnosis Date  . Allergic rhinitis    dust mites  . BPH with obstruction/lower urinary tract symptoms   . BPPV (benign paroxysmal positional vertigo)   . COVID-19 virus infection 03/2019   prolonged post-covid DOE and HR variability.    . Elevated PSA    Prostate bx 09/2018 NORMAL. PSA dec to 7.16 Apr 2019, then 5.14 October 2019.  Marland Kitchen Elevated TSH    2018, not taking biotin. 2020, same-->T4/T3 normal.  . Hyperlipidemia    borderline-->meds not recommended in the past. 06/2019 levels same->TLC  . OSA (obstructive sleep apnea) 08/2019   sleep study->severe OSA->CPAP recommended  . Pulmonary nodule Jan/feb 2021   5 mm R lung; pt low risk for lung ca so NO F/U IMAGING IS INDICATED.    Past Surgical History:  Procedure Laterality Date  . COLONOSCOPY  2012   Normal  . polysomnogram  08/25/2019   severe OSA->CPAP recommended  . PROSTATE BIOPSY  09/2018   NORMAL     Outpatient Medications Prior to Visit  Medication Sig Dispense Refill  . Cholecalciferol (VITAMIN D3) 50 MCG (2000 UT) capsule Take 2,000 Units by mouth daily.    . ferrous sulfate 325 (65 FE) MG EC tablet Take 325 mg by mouth daily.    . fluticasone (FLONASE) 50 MCG/ACT nasal spray INSTILL 2 SPRAYS INTO EACH NOSTRIL EVERY DAY 16 g 1  . Multiple Vitamin (MULTIVITAMIN) tablet Take 1 tablet by mouth daily.    . silodosin (RAPAFLO) 8 MG CAPS capsule TAKE ONE CAPSULE BY MOUTH ONE TIME DAILY  30 capsule 0   No facility-administered medications prior to visit.    Allergies  Allergen Reactions  . Tamsulosin Other (See Comments)    lightheadedness    ROS As per HPI  PE: Vitals with BMI 01/15/2020 11/21/2019 07/27/2019  Height 5' 9.5" 5' 9.5" 5' 9.5"  Weight 189 lbs 6 oz 192 lbs 10 oz 188 lbs  BMI 27.58 92.42 68.34  Systolic 196 222 979  Diastolic 76 65 64  Pulse 57 84 84   Gen: Alert, well appearing.  Patient is oriented to person, place, time, and situation. AFFECT: pleasant, lucid thought and speech. CV: RRR, no m/r/g.   LUNGS: CTA bilat, nonlabored resps, good aeration in all lung fields. EXT: no clubbing or cyanosis.  no edema.  LABS:  Lab Results  Component Value Date   TSH 3.97 04/27/2019   Lab Results  Component Value Date   WBC 3.9 04/27/2019   HGB 15.0 04/27/2019   HCT 44.7 04/27/2019   MCV 96.8 04/27/2019   PLT 307 04/27/2019   Lab Results  Component Value Date   CREATININE 1.21 07/13/2019   BUN 16 07/13/2019   NA 134 (L) 07/13/2019   K 4.6 07/13/2019   CL 102 07/13/2019   CO2 29 07/13/2019   Lab Results  Component Value Date   ALT 20 07/13/2019   AST 30 07/13/2019   ALKPHOS 49 07/13/2019   BILITOT 0.6 07/13/2019   Lab Results  Component Value Date   CHOL 220 (H) 07/13/2019   Lab Results  Component Value Date   HDL 73.30 07/13/2019   Lab Results  Component Value Date   LDLCALC 136 (H) 07/13/2019   Lab Results  Component Value Date    TRIG 55.0 07/13/2019   Lab Results  Component Value Date   CHOLHDL 3 07/13/2019   Lab Results  Component Value Date   PSA 5.03 10/24/2019   PSA 7.3 05/04/2019   PSA 9 08/08/2018   Lab Results  Component Value Date   HGBA1C 5.5 07/13/2019   IMPRESSION AND PLAN:  Asymptomatic HR variability. I have done an EKG 04/27/19 when he had same complaint and it was normal (HR 74, no ectopy). Normal electrolytes, CBC, and TSH as part of w/u as well. I tried to reassure him that he is likely seeing normal HR variability (asymptomatic) that he has always had and he is only aware of it b/c he is checking this (and pulse oximetry -->always mid 90s) regularly since covid. He was not quite completely reassured so we agreed that a cardiologist evaluation would be an appropriate next step.  Referral ordered today.  Flu vaccine given today.  An After Visit Summary was printed and given to the patient.  FOLLOW UP: Return for as needed.  Signed:  Crissie Sickles, MD           01/15/2020

## 2020-01-18 NOTE — Progress Notes (Signed)
Cardiology Office Note:   Date:  01/19/2020  NAME:  Christopher Banks    MRN: [id number] DOB:  [birth year]   PCP:  Tammi Sou, MD  Cardiologist:  No primary care provider on file.  Electrophysiologist:  None   Referring MD: Tammi Sou, MD   Chief Complaint  Patient presents with   Tachycardia   History of Present Illness:   Christopher Banks is a 64 y.o. male with a hx of HLD, OSA who is being seen today for the evaluation of tachycardia at the request of McGowen, Adrian Blackwater, MD.  He reports he had COVID-19 pneumonia around 9 months ago.  Since then he is noticed that his heart rate can go up into the 100 range with minimal activity.  He reports has been checking his heart rate on his pulse oximeter.  He bought a pulse oximeter when he had Covid to monitor his oxygen level.  He describes no symptoms such as rapid heartbeat sensation or irregular heartbeat rhythm.  He reports he is just noticed that his heart rate is fast at times.  Resting heart rate is around 6070 bpm.  When he exercises at times he reports heart rate can go up to around 100 and this is inappropriate for him.  He is never really checked his heart rate before all this.  No symptoms of chest pain, shortness of breath or palpitations.  This is merely that he has noticed his heart rate is fast.  Medical history significant for hyperlipidemia.  Most recent lipid profile shows total cholesterol 220, HDL 73, LDL 136, triglycerides 55.  He is not diabetic.  Most recent A1c 5.5.  He did have thyroid studies performed by his PCP in January which demonstrated TSH of 3.97.  Also checked a BNP which was 10.  He reports he does weightlifting but no aerobic activity.  No limitations with this.  He works as an Chief Financial Officer.  He does have sleep apnea but wears a CPAP.  There is no strong family history of heart attack or stroke.  He is a never smoker.  He does drink alcohol daily but this is in moderation.  No illicit drug use is reported.   Labs also notable for hemoglobin of 15.  He is not anemic.  TSH 04/27/2019-> 3.97  Problem List 1. HLD -T chol 220, HDL 73, LDL 136, TG 55 2. OSA  Past Medical History: Past Medical History:  Diagnosis Date   Allergic rhinitis    dust mites   BPH with obstruction/lower urinary tract symptoms    BPPV (benign paroxysmal positional vertigo)    COVID-19 virus infection 03/2019   prolonged post-covid DOE and HR variability.     Elevated PSA    Prostate bx 09/2018 NORMAL. PSA dec to 7.16 Apr 2019, then 5.14 October 2019.   Elevated TSH    2018, not taking biotin. 2020, same-->T4/T3 normal.   Hyperlipidemia    borderline-->meds not recommended in the past. 06/2019 levels same->[facility]LC   OSA (obstructive sleep apnea) 08/2019   sleep study->severe OSA->CPAP recommended   Pulmonary nodule Jan/feb 2021   5 mm R lung; pt low risk for lung ca so NO F/U IMAGING IS INDICATED.    Past Surgical History: Past Surgical History:  Procedure Laterality Date   COLONOSCOPY  2012   Normal   polysomnogram  08/25/2019   severe OSA->CPAP recommended   PROSTATE BIOPSY  09/2018   NORMAL    Current Medications: Current Meds  Medication Sig  Cholecalciferol (VITAMIN D3) 50 MCG (2000 UT) capsule Take 2,000 Units by mouth daily.   ferrous sulfate 325 (65 FE) MG EC tablet Take 325 mg by mouth daily.   fluticasone (FLONASE) 50 MCG/ACT nasal spray INSTILL 2 SPRAYS INTO EACH NOSTRIL EVERY DAY   Multiple Vitamin (MULTIVITAMIN) tablet Take 1 tablet by mouth daily.   silodosin (RAPAFLO) 8 MG CAPS capsule TAKE ONE CAPSULE BY MOUTH ONE TIME DAILY      Allergies:    Tamsulosin   Social History: Social History   Socioeconomic History   Marital status: Married    Spouse name: Not on file   Number of children: 2   Years of education: Not on file   Highest education level: Not on file  Occupational History   Occupation: Chief Financial Officer  Tobacco Use   Smoking status: Never Smoker   Smokeless  tobacco: Never Used  Scientific laboratory technician Use: Never used  Substance and Sexual Activity   Alcohol use: Yes    Comment: occasionally   Drug use: Never   Sexual activity: Not on file  Other Topics Concern   Not on file  Social History Narrative   Married, 2 children.   Orig from [place], relocated to [place].   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   No tob.   Alc: 2 glasses of wine per night.   Social Determinants of Health   Financial Resource Strain:    Difficulty of Paying Living Expenses: Not on file  Food Insecurity:    Worried About Charity fundraiser in the Last Year: Not on file   [employer] of Food in the Last Year: Not on file  Transportation Needs:    Lack of Transportation (Medical): Not on file   Lack of Transportation (Non-Medical): Not on file  Physical Activity:    Days of Exercise per Week: Not on file   Minutes of Exercise per Session: Not on file  Stress:    Feeling of Stress : Not on file  Social Connections:    Frequency of Communication with Friends and Family: Not on file   Frequency of Social Gatherings with Friends and Family: Not on file   Attends Religious Services: Not on file   Active Member of Clubs or Organizations: Not on file   Attends Archivist Meetings: Not on file   Marital Status: Not on file     Family History: The patient's family history includes Diabetes in his maternal grandfather; Hearing loss in his mother; Hyperlipidemia in his mother; Irritable bowel syndrome in his sister; Other in his brother.  ROS:   All other ROS reviewed and negative. Pertinent positives noted in the HPI.     EKGs/Labs/Other Studies Reviewed:   The following studies were personally reviewed by me today:  EKG:  EKG is ordered today.  The ekg ordered today demonstrates normal sinus rhythm, heart rate 60, no acute ischemic changes, no evidence of prior infarction, and was personally reviewed by me.    Recent Labs: 04/27/2019: Brain Natriuretic Peptide 10; Hemoglobin 15.0; Platelets 307; TSH 3.97 07/13/2019: ALT 20; BUN 16; Creatinine, Ser 1.21; Potassium 4.6; Sodium 134   Recent Lipid Panel    Component Value Date/Time   CHOL 220 (H) 07/13/2019 0855   TRIG 55.0 07/13/2019 0855   HDL 73.30 07/13/2019 0855   CHOLHDL 3 07/13/2019 0855   VLDL 11.0 07/13/2019 0855   LDLCALC 136 (H) 07/13/2019 6384  Physical Exam:   VS:  BP (!) 104/55    Pulse 60    Ht 5' 9.5" (1.765 m)    Wt 190 lb 12.8 oz (86.5 kg)    SpO2 99%    BMI 27.77 kg/m    Wt Readings from Last 3 Encounters:  01/19/20 190 lb 12.8 oz (86.5 kg)  01/15/20 189 lb 6.4 oz (85.9 kg)  11/21/19 192 lb 9.6 oz (87.4 kg)    General: Well nourished, well developed, in no acute distress Heart: Atraumatic, normal size  Eyes: PEERLA, EOMI  Neck: Supple, no JVD Endocrine: No thryomegaly Cardiac: Normal S1, S2; RRR; no murmurs, rubs, or gallops Lungs: Clear to auscultation bilaterally, no wheezing, rhonchi or rales  Abd: Soft, nontender, no hepatomegaly  Ext: No edema, pulses 2+ Musculoskeletal: No deformities, BUE and BLE strength normal and equal Skin: Warm and dry, no rashes   Neuro: Alert and oriented to person, place, time, and situation, CNII-XII grossly intact, no focal deficits  Psych: Normal mood and affect   ASSESSMENT:   Christopher Banks is a 64 y.o. male who presents for the following: 1. Tachycardia   2. Mixed hyperlipidemia     PLAN:   1. Tachycardia -He is noticed asymptomatic increases in heart rate inappropriate times.  Unclear if his oximeter is actually catching accurate heart rates.  He has no symptoms of chest pain, shortness of breath or palpitations.  Most recent thyroid studies are normal.  He is not anemic.  I recommended a 3-day Zio patch just to get an idea of what is heart rate is doing.  We will also exclude any arrhythmia.  His EKG today demonstrates normal sinus rhythm with no acute ischemic changes.   His cardiovascular examination is normal.  He has no symptoms and I see no need for further testing other than the above monitor.  I will follow-up the results with him by phone.  If they are abnormal I will see him back if they are normal we will see him as needed.  2. Mixed hyperlipidemia -Managed with diet and exercise.  Disposition: Return if symptoms worsen or fail to improve.  Medication Adjustments/Labs and Tests Ordered: Current medicines are reviewed at length with the patient today.  Concerns regarding medicines are outlined above.  Orders Placed This Encounter  Procedures   LONG TERM MONITOR (3-14 DAYS)   No orders of the defined types were placed in this encounter.   Patient Instructions  Medication Instructions:  The current medical regimen is effective;  continue present plan and medications.  *If you need a refill on your cardiac medications before your next appointment, please call your pharmacy*   Testing/Procedures: Your physician has recommended that you wear a 3 DAY ZIO-PATCH monitor. The Zio patch cardiac monitor continuously records heart rhythm data, this is for patients being evaluated for multiple types heart rhythms. For the first 24 hours post application, please avoid getting the Zio monitor wet in the shower or by excessive sweating during exercise. After that, feel free to carry on with regular activities. Keep soaps and lotions away from the ZIO XT Patch.  Someone will be calling you to let you know when this is mailed.  This will be mailed to you, please expect 7-10 days to receive.      Call [facility] at [phone] if you have questions regarding your ZIO XT patch monitor.  Call them immediately if you see an orange light blinking on your monitor.  If your monitor falls off in less than 4 days contact our Monitor department at [phone].  If your monitor becomes loose or falls off after 4 days call [employer] at  [phone] for suggestions on securing your monitor     Follow-Up: At [facility], you and your health needs are our priority.  As part of our continuing mission to provide you with exceptional heart care, we have created designated Provider Care Teams.  These Care Teams include your primary Cardiologist (physician) and Advanced Practice Providers (APPs -  Physician Assistants and Nurse Practitioners) who all work together to provide you with the care you need, when you need it.  We recommend signing up for the patient portal called "MyChart".  Sign up information is provided on this After Visit Summary.  MyChart is used to connect with patients for Virtual Visits (Telemedicine).  Patients are able to view lab/test results, encounter notes, upcoming appointments, etc.  Non-urgent messages can be sent to your provider as well.   To learn more about what you can do with MyChart, go to [email or web address].    Your next appointment:   As needed  The format for your next appointment:   In Person  Provider:   Eleonore Chiquito, MD         Signed, Addison Naegeli. Audie Box, [employer]  [address] [place], [place] [address] [phone]  01/19/2020 4:39 PM

## 2020-01-19 ENCOUNTER — Other Ambulatory Visit: Payer: Self-pay

## 2020-01-19 ENCOUNTER — Ambulatory Visit (INDEPENDENT_AMBULATORY_CARE_PROVIDER_SITE_OTHER): Payer: BC Managed Care – PPO | Admitting: Cardiovascular Disease

## 2020-01-19 ENCOUNTER — Encounter: Payer: Self-pay | Admitting: Cardiovascular Disease

## 2020-01-19 ENCOUNTER — Ambulatory Visit (INDEPENDENT_AMBULATORY_CARE_PROVIDER_SITE_OTHER): Payer: BC Managed Care – PPO

## 2020-01-19 ENCOUNTER — Telehealth: Payer: Self-pay | Admitting: Radiology

## 2020-01-19 VITALS — BP 104/55 | HR 60 | Ht 69.5 in | Wt 190.8 lb

## 2020-01-19 DIAGNOSIS — R000 Tachycardia, unspecified: Secondary | ICD-10-CM | POA: Diagnosis not present

## 2020-01-19 DIAGNOSIS — E782 Mixed hyperlipidemia: Secondary | ICD-10-CM

## 2020-01-19 NOTE — Patient Instructions (Signed)
Medication Instructions:  The current medical regimen is effective;  continue present plan and medications.  *If you need a refill on your cardiac medications before your next appointment, please call your pharmacy*   Testing/Procedures: Your physician has recommended that you wear a 3 DAY ZIO-PATCH monitor. The Zio patch cardiac monitor continuously records heart rhythm data, this is for patients being evaluated for multiple types heart rhythms. For the first 24 hours post application, please avoid getting the Zio monitor wet in the shower or by excessive sweating during exercise. After that, feel free to carry on with regular activities. Keep soaps and lotions away from the ZIO XT Patch.  Someone will be calling you to let you know when this is mailed.  This will be mailed to you, please expect 7-10 days to receive.      Call [facility] at [phone] if you have questions regarding your ZIO XT patch monitor.  Call them immediately if you see an orange light blinking on your monitor.   If your monitor falls off in less than 4 days contact our Monitor department at [phone].  If your monitor becomes loose or falls off after 4 days call [employer] at [phone] for suggestions on securing your monitor     Follow-Up: At [facility], you and your health needs are our priority.  As part of our continuing mission to provide you with exceptional heart care, we have created designated Provider Care Teams.  These Care Teams include your primary Cardiologist (physician) and Advanced Practice Providers (APPs -  Physician Assistants and Nurse Practitioners) who all work together to provide you with the care you need, when you need it.  We recommend signing up for the patient portal called "MyChart".  Sign up information is provided on this After Visit Summary.  MyChart is used to connect with patients for Virtual Visits (Telemedicine).  Patients are able to view lab/test  results, encounter notes, upcoming appointments, etc.  Non-urgent messages can be sent to your provider as well.   To learn more about what you can do with MyChart, go to [email or web address].    Your next appointment:   As needed  The format for your next appointment:   In Person  Provider:   Eleonore Chiquito, MD

## 2020-01-19 NOTE — Telephone Encounter (Signed)
Enrolled patient for a 3 Day Zio monitor to be mailed to patients home.

## 2020-01-24 NOTE — Addendum Note (Signed)
Addended by: Wonda Horner on: 01/24/2020 10:45 AM   Modules accepted: Orders

## 2020-01-26 HISTORY — PX: OTHER SURGICAL HISTORY: SHX169

## 2020-02-16 DIAGNOSIS — Z20822 Contact with and (suspected) exposure to covid-19: Secondary | ICD-10-CM | POA: Diagnosis not present

## 2020-02-16 DIAGNOSIS — Z03818 Encounter for observation for suspected exposure to other biological agents ruled out: Secondary | ICD-10-CM | POA: Diagnosis not present

## 2020-03-28 DIAGNOSIS — G4733 Obstructive sleep apnea (adult) (pediatric): Secondary | ICD-10-CM | POA: Diagnosis not present

## 2020-05-21 ENCOUNTER — Telehealth: Payer: Self-pay | Admitting: Podiatry

## 2020-05-21 NOTE — Telephone Encounter (Signed)
Patient called in stating he was returning call, Please Advise

## 2020-05-21 NOTE — Telephone Encounter (Signed)
I have called and spoken with him. He was upset about the care this wife is getting at the hospital. We had a long discussion about what is going on and offered by support and if there is anything I can do let me know.

## 2020-05-27 NOTE — Progress Notes (Addendum)
PATIENT: Christopher Banks DOB: 1955/07/28  REASON FOR VISIT: follow up HISTORY FROM: patient  Virtual Visit via Telephone Note  I connected with Christopher Banks on 05/27/20 at  8:00 AM EST by telephone and verified that I am speaking with the correct person using two identifiers.   I discussed the limitations, risks, security and privacy concerns of performing an evaluation and management service by telephone and the availability of in person appointments. I also discussed with the patient that there may be a patient responsible charge related to this service. The patient expressed understanding and agreed to proceed.   History of Present Illness:  05/27/20 ALL:  Christopher Banks is a 65 y.o. male here today for follow up for OSA on CPAP.  He reports that he is doing well with CPAP therapy.  He has no longer snoring.  He seems to be resting better.  He does continue to note a leak in his mask.  He is working with the DME company to find a mask that works best for him.  Compliance report dated 04/27/2020 through 05/26/2020 reveals that he used CPAP 30 of the past 30 days for compliance of 100%.  He is CPAP greater than 4 hours 29 of the past 30 days for compliance of 97%.  Average usage on days used was 7 hours.  Residual AHI was 1.3 on 7 to 14 cm of water and an EPR of 1.  There was a leak noted in the 95th percentile of 27.2 L/min.   11/21/19 ALL:  Christopher Banks is a 65 y.o. male here today for follow up for recently diagnosed OSA started on CPAP therapy. HST on 5/12 showed "severe obstructive sleep apnea with a total AHI of 39.1/hour and O2 nadir of 78%". He reports that he is adjusting to therapy. He is using a full face mask and feels that he is doing fairly well. He does note a leak from time to time but has been monitoring this at home. He does not sleep well. He is restless. He exercises every morning. He is getting about 7 hours of sleep nightly.   Compliance report dated 10/21/2019  through 11/19/2019 reveals that he has used CPAP therapy 30 of the past 30 days for compliance of 100%. He is CPAP greater than 4 hours 29 in the past 30 days for compliance of 97%. Average usage was 7 hours and 25 minutes. Residual AHI was 3.9 on 7 to 14 cm of water and EPR 1. There was a leak noted in the 95th percentile of 23.5 L/min.  HISTORY: (copied from Dr Guadelupe Sabin note on 07/27/2019)  Dear Dr. Anitra Lauth,  I saw your patient, Christopher Banks, upon your kind request of a sleep clinic today for initial consultation of his sleep disorder combined particular, concern for underlying obstructive sleep apnea. The patient is unaccompanied today. As you know, Mr. Christopher Banks is a 65 year old right-handed gentleman with an underlying medical history of hyperlipidemia, elevated TSH, elevated PSA, vertigo, allergic rhinitis, history of Covid diagnosis in December 2020, pulmonary nodule, and mildly overweight state, who reports snoring and excessive daytime somnolence at times as well as witnessed apneas per wife's report. I reviewed your office note from 07/13/2019. His Epworth sleepiness score is 10 out of 24, fatigue severity score is 28 out of 63. He lives with his wife, they have 2 grandchildren and first grandchild on the way. He is a non-smoker and drinks alcohol in the form of wine, 1 to 2 glasses/day. He limits  his caffeine to 1 cup of coffee per day on average. He exercises in the mornings. He goes to bed around 10 and rise time is typically between 5 and 5:45 AM. He is often awake early. He has nocturia about 2-3 times per average night and denies recurrent morning headaches. He has had intermittent vertigo symptoms. He works as an Chief Financial Officer, currently back in the office. He has a TV in the bedroom but does not watch it. They have 2 small dogs in the household, both sleep in the bedroom, one of them typically on the bed with them. He has no obvious family history of sleep apnea. He has woken up with  a leg cramp but denies any telltale symptoms of restless leg syndrome   Observations/Objective:  Generalized: Well developed, in no acute distress  Mentation: Alert oriented to time, place, history taking. Follows all commands speech and language fluent   Assessment and Plan:  65 y.o. year old male  has a past medical history of Allergic rhinitis, BPH with obstruction/lower urinary tract symptoms, BPPV (benign paroxysmal positional vertigo), COVID-19 virus infection (03/2019), Elevated PSA, Elevated TSH, Hyperlipidemia, OSA (obstructive sleep apnea) (08/2019), and Pulmonary nodule (Jan/feb 2021). here with  No diagnosis found.   Christopher Banks is doing very well on CPAP therapy.  Compliance report reveals excellent compliance.  He was encouraged to continue using CPAP nightly and greater than 4 hours each night.  I will send orders for a mask refitting.  He continues to communicate with his DME company regarding leak.  Apnea is very well managed and AHI is at goal.  We will update supply orders today.  He will continue close follow-up with primary care.  Healthy lifestyle habits encouraged.  We will follow-up with [place] in 1 year, sooner if needed.  He verbalizes understanding and agreement with this plan.  No orders of the defined types were placed in this encounter.   No orders of the defined types were placed in this encounter.    Follow Up Instructions:  I discussed the assessment and treatment plan with the patient. The patient was provided an opportunity to ask questions and all were answered. The patient agreed with the plan and demonstrated an understanding of the instructions.   The patient was advised to call back or seek an in-person evaluation if the symptoms worsen or if the condition fails to improve as anticipated.  I provided 15 minutes of non-face-to-face time during this encounter. Patient located at his place of residence during [place] visit, provider is in the office.    Debbora Presto, NP   I reviewed the above note and documentation by the Nurse Practitioner and agree with the history, exam, assessment and plan as outlined above. I was available for consultation. Star Age, MD, PhD [facility])

## 2020-05-27 NOTE — Patient Instructions (Signed)
Please continue using your CPAP regularly. While your insurance requires that you use CPAP at least 4 hours each night on 70% of the nights, I recommend, that you not skip any nights and use it throughout the night if you can. Getting used to CPAP and staying with the treatment long term does take time and patience and discipline. Untreated obstructive sleep apnea when it is moderate to severe can have an adverse impact on cardiovascular health and raise her risk for heart disease, arrhythmias, hypertension, congestive heart failure, stroke and diabetes. Untreated obstructive sleep apnea causes sleep disruption, nonrestorative sleep, and sleep deprivation. This can have an impact on your day to day functioning and cause daytime sleepiness and impairment of cognitive function, memory loss, mood disturbance, and problems focussing. Using CPAP regularly can improve these symptoms.   Sleep Apnea Sleep apnea affects breathing during sleep. It causes breathing to stop for a short time or to become shallow. It can also increase the risk of:  Heart attack.  Stroke.  Being very overweight (obese).  Diabetes.  Heart failure.  Irregular heartbeat. The goal of treatment is to help you breathe normally again. What are the causes? There are three kinds of sleep apnea:  Obstructive sleep apnea. This is caused by a blocked or collapsed airway.  Central sleep apnea. This happens when the brain does not send the right signals to the muscles that control breathing.  Mixed sleep apnea. This is a combination of obstructive and central sleep apnea. The most common cause of this condition is a collapsed or blocked airway. This can happen if:  Your throat muscles are too relaxed.  Your tongue and tonsils are too large.  You are overweight.  Your airway is too small.   What increases the risk?  Being overweight.  Smoking.  Having a small airway.  Being older.  Being male.  Drinking  alcohol.  Taking medicines to calm yourself (sedatives or tranquilizers).  Having family members with the condition. What are the signs or symptoms?  Trouble staying asleep.  Being sleepy or tired during the day.  Getting angry a lot.  Loud snoring.  Headaches in the morning.  Not being able to focus your mind (concentrate).  Forgetting things.  Less interest in sex.  Mood swings.  Personality changes.  Feelings of sadness (depression).  Waking up a lot during the night to pee (urinate).  Dry mouth.  Sore throat. How is this diagnosed?  Your medical history.  A physical exam.  A test that is done when you are sleeping (sleep study). The test is most often done in a sleep lab but may also be done at home. How is this treated?  Sleeping on your side.  Using a medicine to get rid of mucus in your nose (decongestant).  Avoiding the use of alcohol, medicines to help you relax, or certain pain medicines (narcotics).  Losing weight, if needed.  Changing your diet.  Not smoking.  Using a machine to open your airway while you sleep, such as: ? An oral appliance. This is a mouthpiece that shifts your lower jaw forward. ? A CPAP device. This device blows air through a mask when you breathe out (exhale). ? An EPAP device. This has valves that you put in each nostril. ? A BPAP device. This device blows air through a mask when you breathe in (inhale) and breathe out.  Having surgery if other treatments do not work. It is important to get treatment for sleep  apnea. Without treatment, it can lead to:  High blood pressure.  Coronary artery disease.  In men, not being able to have an erection (impotence).  Reduced thinking ability.   Follow these instructions at home: Lifestyle  Make changes that your doctor recommends.  Eat a healthy diet.  Lose weight if needed.  Avoid alcohol, medicines to help you relax, and some pain medicines.  Do not use any  products that contain nicotine or tobacco, such as cigarettes, e-cigarettes, and chewing tobacco. If you need help quitting, ask your doctor. General instructions  Take over-the-counter and prescription medicines only as told by your doctor.  If you were given a machine to use while you sleep, use it only as told by your doctor.  If you are having surgery, make sure to tell your doctor you have sleep apnea. You may need to bring your device with you.  Keep all follow-up visits as told by your doctor. This is important. Contact a doctor if:  The machine that you were given to use during sleep bothers you or does not seem to be working.  You do not get better.  You get worse. Get help right away if:  Your chest hurts.  You have trouble breathing in enough air.  You have an uncomfortable feeling in your back, arms, or stomach.  You have trouble talking.  One side of your body feels weak.  A part of your face is hanging down. These symptoms may be an emergency. Do not wait to see if the symptoms will go away. Get medical help right away. Call your local emergency services (911 in the [place] Do not drive yourself to the hospital. Summary  This condition affects breathing during sleep.  The most common cause is a collapsed or blocked airway.  The goal of treatment is to help you breathe normally while you sleep. This information is not intended to replace advice given to you by your health care provider. Make sure you discuss any questions you have with your health care provider. Document Revised: 01/14/2018 Document Reviewed: 11/23/2017 Elsevier Patient Education  [place].

## 2020-05-28 ENCOUNTER — Encounter: Payer: Self-pay | Admitting: Family Medicine

## 2020-05-28 ENCOUNTER — Telehealth (INDEPENDENT_AMBULATORY_CARE_PROVIDER_SITE_OTHER): Payer: BC Managed Care – PPO | Admitting: Family Medicine

## 2020-05-28 DIAGNOSIS — G4733 Obstructive sleep apnea (adult) (pediatric): Secondary | ICD-10-CM | POA: Diagnosis not present

## 2020-05-28 DIAGNOSIS — Z9989 Dependence on other enabling machines and devices: Secondary | ICD-10-CM

## 2020-05-30 NOTE — Progress Notes (Signed)
CM sent to [facility]  

## 2020-06-17 ENCOUNTER — Other Ambulatory Visit: Payer: Self-pay | Admitting: Urology

## 2020-06-26 DIAGNOSIS — G4733 Obstructive sleep apnea (adult) (pediatric): Secondary | ICD-10-CM | POA: Diagnosis not present

## 2020-07-25 DIAGNOSIS — F919 Conduct disorder, unspecified: Secondary | ICD-10-CM | POA: Diagnosis not present

## 2020-08-27 DIAGNOSIS — F919 Conduct disorder, unspecified: Secondary | ICD-10-CM | POA: Diagnosis not present

## 2020-09-30 ENCOUNTER — Other Ambulatory Visit: Payer: Self-pay | Admitting: Urology

## 2020-10-22 IMAGING — DX DG CHEST 2V
2 series · 2 of 2 positions shown · non-contrast
Comparison: None.

CLINICAL DATA: Shortness of breath.  COVID positive.

EXAM:
CHEST - 2 VIEW

[chest pa]
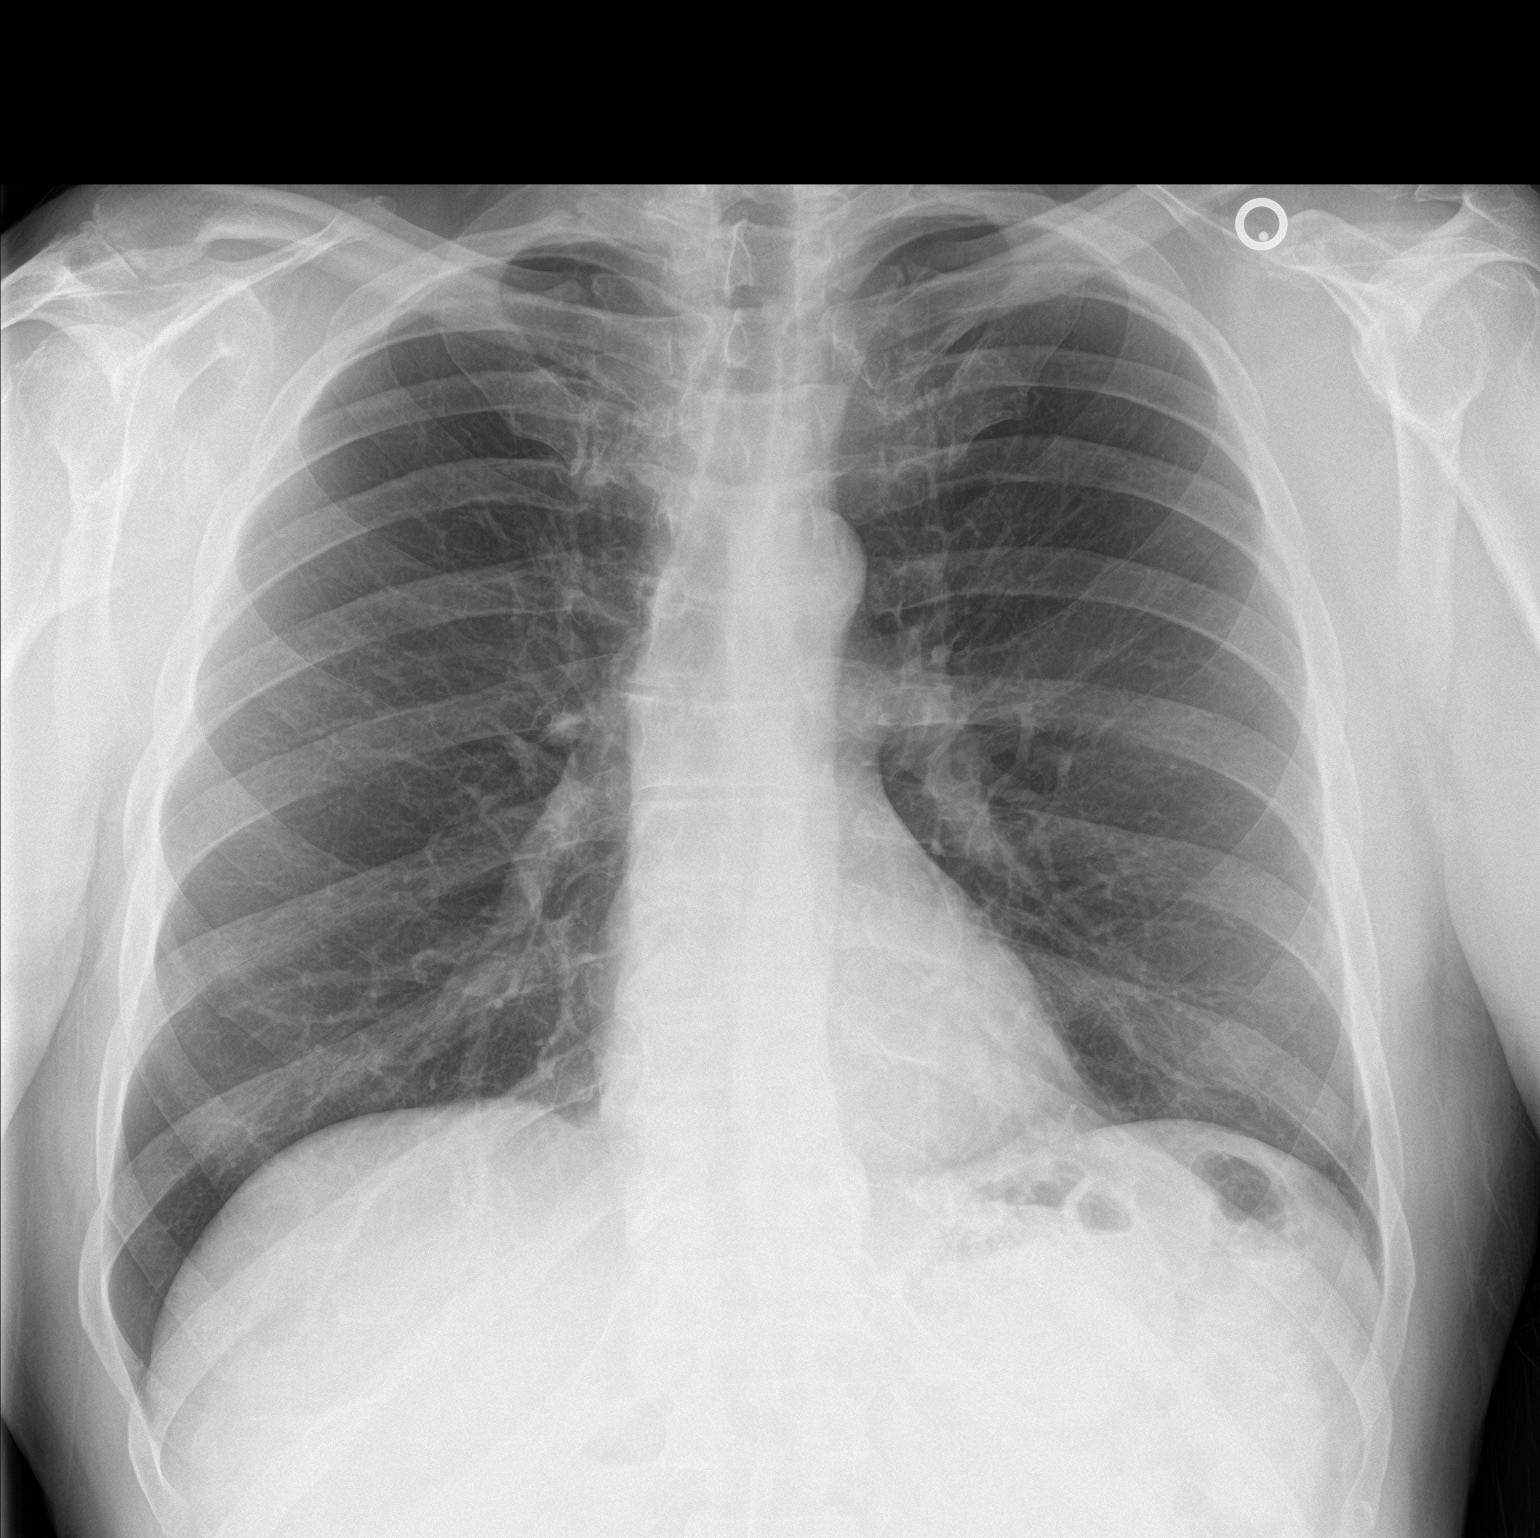

[chest lat]
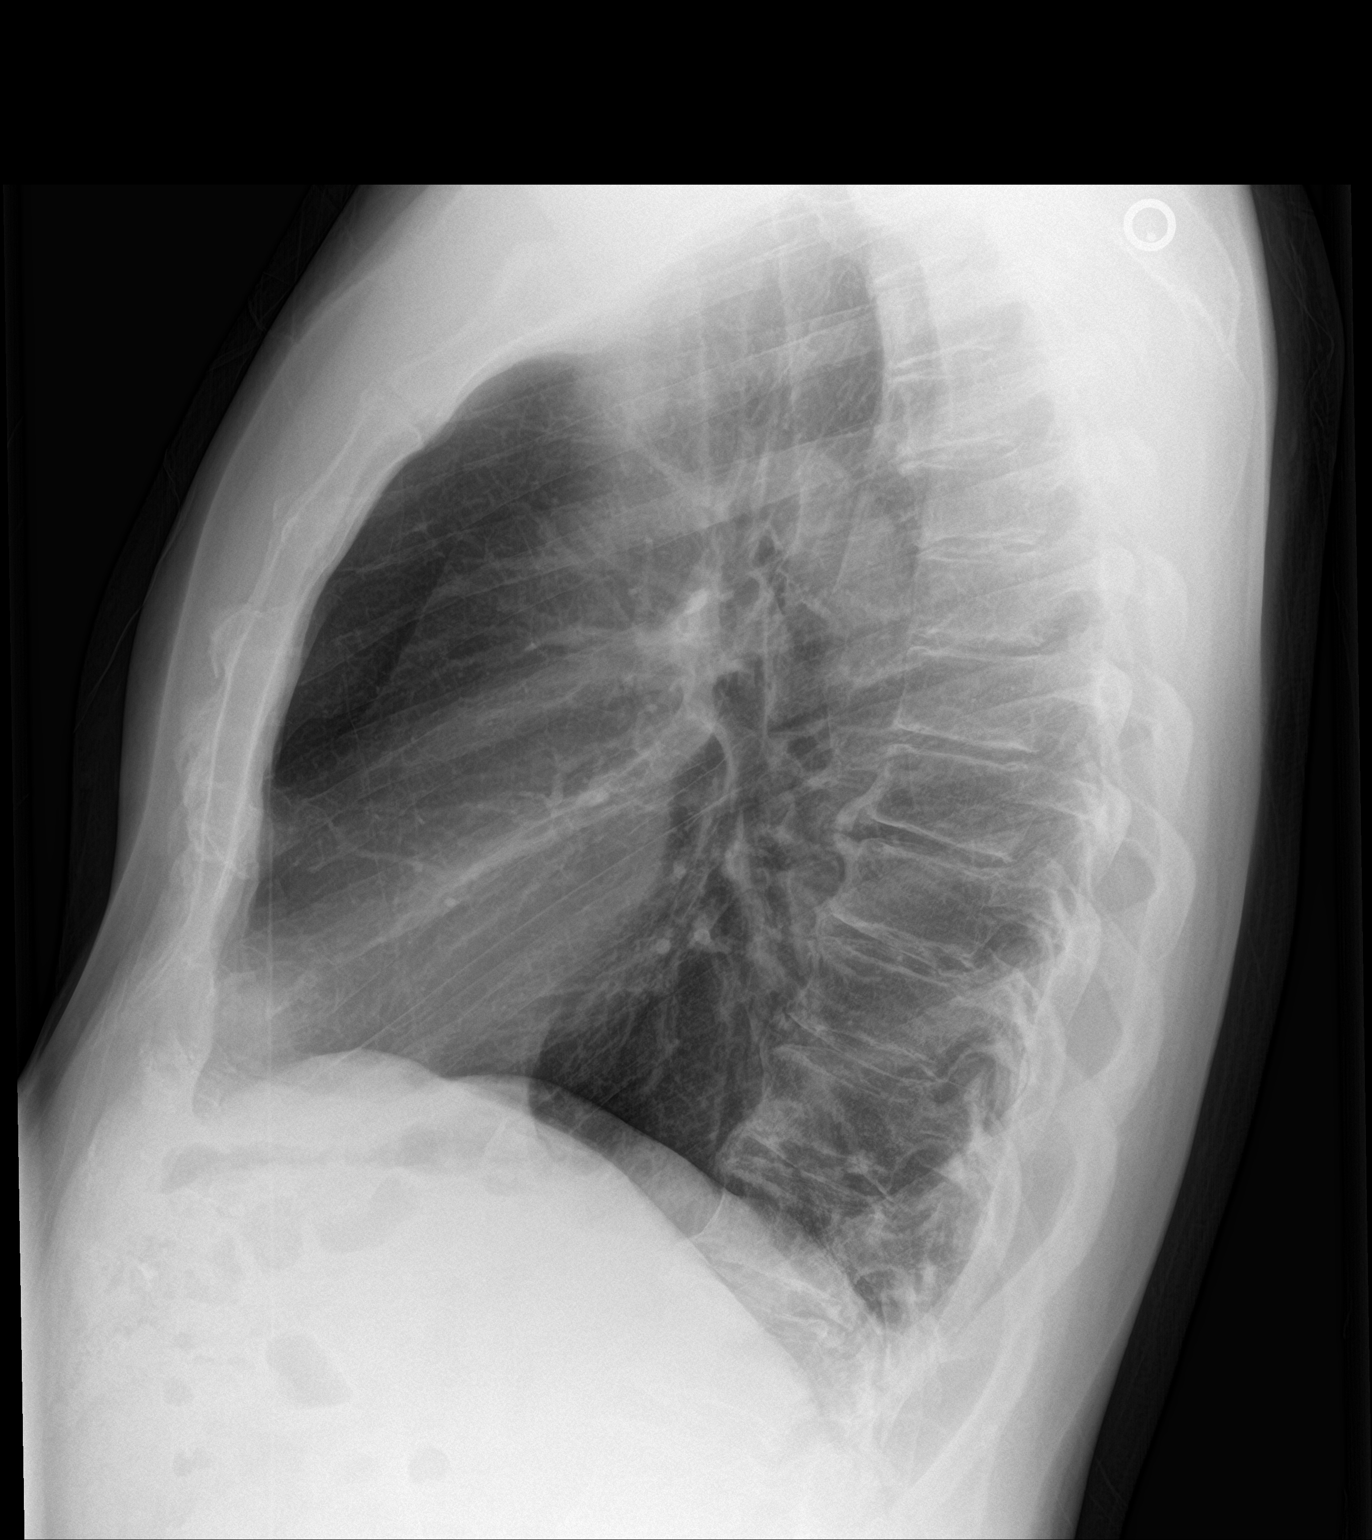

[2 of 2 positions shown; findings below may reference images not displayed]

FINDINGS: The lungs are clear without focal pneumonia, edema, pneumothorax or
pleural effusion. Small nodular density identified over the lower
spine on the lateral projection. The cardiopericardial silhouette is
within normal limits for size. The visualized bony structures of the
thorax are intact.
IMPRESSION: 1. No evidence for edema or focal consolidation.
2. Small nodular opacity identified on the lateral view consistent
with lower lobe location. CT chest with contrast recommended to
further evaluate.

## 2020-10-28 ENCOUNTER — Other Ambulatory Visit: Payer: Self-pay

## 2020-10-30 ENCOUNTER — Ambulatory Visit: Payer: BC Managed Care – PPO | Admitting: Family Medicine

## 2020-10-31 ENCOUNTER — Encounter: Payer: Self-pay | Admitting: Family Medicine

## 2020-10-31 ENCOUNTER — Other Ambulatory Visit: Payer: Self-pay

## 2020-10-31 ENCOUNTER — Ambulatory Visit (INDEPENDENT_AMBULATORY_CARE_PROVIDER_SITE_OTHER): Payer: BC Managed Care – PPO | Admitting: Family Medicine

## 2020-10-31 VITALS — BP 100/61 | HR 56 | Temp 98.1°F | Ht 69.5 in | Wt 190.0 lb

## 2020-10-31 DIAGNOSIS — Z0000 Encounter for general adult medical examination without abnormal findings: Secondary | ICD-10-CM | POA: Diagnosis not present

## 2020-10-31 DIAGNOSIS — Z1211 Encounter for screening for malignant neoplasm of colon: Secondary | ICD-10-CM | POA: Diagnosis not present

## 2020-10-31 DIAGNOSIS — E785 Hyperlipidemia, unspecified: Secondary | ICD-10-CM | POA: Diagnosis not present

## 2020-10-31 DIAGNOSIS — Z862 Personal history of diseases of the blood and blood-forming organs and certain disorders involving the immune mechanism: Secondary | ICD-10-CM

## 2020-10-31 DIAGNOSIS — E559 Vitamin D deficiency, unspecified: Secondary | ICD-10-CM

## 2020-10-31 DIAGNOSIS — E038 Other specified hypothyroidism: Secondary | ICD-10-CM

## 2020-10-31 DIAGNOSIS — Z23 Encounter for immunization: Secondary | ICD-10-CM | POA: Diagnosis not present

## 2020-10-31 LAB — LIPID PANEL
Cholesterol: 233 mg/dL — ABNORMAL HIGH (ref 0–200)
HDL: 84.5 mg/dL (ref >39.00)
LDL Cholesterol: 133 mg/dL — ABNORMAL HIGH (ref 0–99)
NonHDL: 148.15
Total CHOL/HDL Ratio: 3
Triglycerides: 76 mg/dL (ref 0.0–149.0)
VLDL: 15.2 mg/dL (ref 0.0–40.0)

## 2020-10-31 LAB — CBC WITH DIFFERENTIAL/PLATELET
Basophils Absolute: 0 10*3/uL (ref 0.0–0.1)
Basophils Relative: 0.6 % (ref 0.0–3.0)
Eosinophils Absolute: 0.1 10*3/uL (ref 0.0–0.7)
Eosinophils Relative: 2.6 % (ref 0.0–5.0)
HCT: 41.6 % (ref 39.0–52.0)
Hemoglobin: 14.1 g/dL (ref 13.0–17.0)
Lymphocytes Relative: 38.8 % (ref 12.0–46.0)
Lymphs Abs: 1.5 10*3/uL (ref 0.7–4.0)
MCHC: 34 g/dL (ref 30.0–36.0)
MCV: 96.6 fl (ref 78.0–100.0)
Monocytes Absolute: 0.3 10*3/uL (ref 0.1–1.0)
Monocytes Relative: 8.5 % (ref 3.0–12.0)
Neutro Abs: 1.9 10*3/uL (ref 1.4–7.7)
Neutrophils Relative %: 49.5 % (ref 43.0–77.0)
Platelets: 237 10*3/uL (ref 150.0–400.0)
RBC: 4.3 Mil/uL (ref 4.22–5.81)
RDW: 13.2 % (ref 11.5–15.5)
WBC: 3.8 10*3/uL — ABNORMAL LOW (ref 4.0–10.5)

## 2020-10-31 LAB — COMPREHENSIVE METABOLIC PANEL
ALT: 20 U/L (ref 0–53)
AST: 29 U/L (ref 0–37)
Albumin: 4.4 g/dL (ref 3.5–5.2)
Alkaline Phosphatase: 55 U/L (ref 39–117)
BUN: 13 mg/dL (ref 6–23)
CO2: 28 mEq/L (ref 19–32)
Calcium: 9.7 mg/dL (ref 8.4–10.5)
Chloride: 99 mEq/L (ref 96–112)
Creatinine, Ser: 1.09 mg/dL (ref 0.40–1.50)
GFR: 71.18 mL/min (ref >60.00)
Glucose, Bld: 97 mg/dL (ref 70–99)
Potassium: 4.3 mEq/L (ref 3.5–5.1)
Sodium: 135 mEq/L (ref 135–145)
Total Bilirubin: 1.1 mg/dL (ref 0.2–1.2)
Total Protein: 7.1 g/dL (ref 6.0–8.3)

## 2020-10-31 LAB — TSH: TSH: 3.79 u[IU]/mL (ref 0.35–5.50)

## 2020-10-31 LAB — VITAMIN D 25 HYDROXY (VIT D DEFICIENCY, FRACTURES): VITD: 37.92 ng/mL (ref 30.00–100.00)

## 2020-10-31 LAB — T4, FREE: Free T4: 0.81 ng/dL (ref 0.60–1.60)

## 2020-10-31 NOTE — Progress Notes (Signed)
Office Note 10/31/2020  CC:  Chief Complaint  Patient presents with   Annual Exam    Pt is fasting   Gastroesophageal Reflux    Pt c/o indigestion x 3-4     HPI:  Christopher Banks is a 65 y.o. male who is here for annual health maintenance exam. Doing well. Looking forward to retiring about 1 yr. Works out 4 d/week. Pretty healthy diet.  Taking supplemental iron tab daily->325mg  ferr sulf, has remote hx of IDA. Hx of vit D def, takes 2000 U vit D3 daily.  Past Medical History:  Diagnosis Date   Allergic rhinitis    dust mites   BPH with obstruction/lower urinary tract symptoms    BPPV (benign paroxysmal positional vertigo)    COVID-19 virus infection 03/2019   prolonged post-covid DOE and HR variability.     Elevated PSA    Prostate bx 09/2018 NORMAL. PSA dec to 7.16 Apr 2019, then 5.14 October 2019.   Elevated TSH    2018, not taking biotin. 2020, same-->T4/T3 normal.   Hyperlipidemia    borderline-->meds not recommended in the past. 06/2019 levels same->TLC   OSA (obstructive sleep apnea) 08/2019   sleep study->severe OSA->CPAP recommended   Pulmonary nodule Jan/feb 2021   5 mm R lung; pt low risk for lung ca so NO F/U IMAGING IS INDICATED.    Past Surgical History:  Procedure Laterality Date   COLONOSCOPY  2012   Normal   polysomnogram  08/25/2019   severe OSA->CPAP recommended   PROSTATE BIOPSY  09/2018   NORMAL   ZIO patch  01/26/2020   NORMAL    Family History  Problem Relation Age of Onset   Hearing loss Mother    Hyperlipidemia Mother    Irritable bowel syndrome Sister    Other Brother        elevated PSA   Diabetes Maternal Grandfather     Social History   Socioeconomic History   Marital status: Married    Spouse name: Not on file   Number of children: 2   Years of education: Not on file   Highest education level: Not on file  Occupational History   Occupation: Chief Financial Officer  Tobacco Use   Smoking status: Never   Smokeless tobacco: Never   Vaping Use   Vaping Use: Never used  Substance and Sexual Activity   Alcohol use: Yes    Comment: occasionally   Drug use: Never   Sexual activity: Not on file  Other Topics Concern   Not on file  Social History Narrative   Married, 2 children.   Orig from [place], relocated to [place].   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   No tob.   Alc: 2 glasses of wine per night.   Social Determinants of Health   Financial Resource Strain: Not on file  Food Insecurity: Not on file  Transportation Needs: Not on file  Physical Activity: Not on file  Stress: Not on file  Social Connections: Not on file  Intimate Partner Violence: Not on file    Outpatient Medications Prior to Visit  Medication Sig Dispense Refill   Cholecalciferol (VITAMIN D3) 50 MCG (2000 UT) capsule Take 2,000 Units by mouth daily.     ferrous sulfate 325 (65 FE) MG EC tablet Take 325 mg by mouth daily.     fluticasone (FLONASE) 50 MCG/ACT nasal spray INSTILL 2 SPRAYS INTO EACH NOSTRIL EVERY DAY 16 g 1   Multiple  Vitamin (MULTIVITAMIN) tablet Take 1 tablet by mouth daily.     silodosin (RAPAFLO) 8 MG CAPS capsule TAKE ONE CAPSULE BY MOUTH DAILY AT BEDTIME 90 capsule 0   No facility-administered medications prior to visit.    Allergies  Allergen Reactions   Tamsulosin Other (See Comments)    lightheadedness   ROS Review of Systems  Constitutional:  Negative for appetite change, chills, fatigue and fever.  HENT:  Negative for congestion, dental problem, ear pain and sore throat.   Eyes:  Negative for discharge, redness and visual disturbance.  Respiratory:  Negative for cough, chest tightness, shortness of breath and wheezing.   Cardiovascular:  Negative for chest pain, palpitations and leg swelling.  Gastrointestinal:  Negative for abdominal pain, blood in stool, diarrhea, nausea and vomiting.  Genitourinary:  Negative for difficulty urinating, dysuria, flank pain, frequency,  hematuria and urgency.  Musculoskeletal:  Negative for arthralgias, back pain, joint swelling, myalgias and neck stiffness.  Skin:  Negative for pallor and rash.  Neurological:  Negative for dizziness, speech difficulty, weakness and headaches.  Hematological:  Negative for adenopathy. Does not bruise/bleed easily.  Psychiatric/Behavioral:  Negative for confusion and sleep disturbance. The patient is not nervous/anxious.    PE; Vitals with BMI 10/31/2020 01/19/2020 01/15/2020  Height 5' 9.5" 5' 9.5" 5' 9.5"  Weight 190 lbs 190 lbs 13 oz 189 lbs 6 oz  BMI 27.67 92.42 68.34  Systolic 196 222 979  Diastolic 61 55 76  Pulse 56 60 57   Gen: Alert, well appearing.  Patient is oriented to person, place, time, and situation. AFFECT: pleasant, lucid thought and speech. ENT: Ears: EACs clear, normal epithelium.  TMs with good light reflex and landmarks bilaterally.  Eyes: no injection, icteris, swelling, or exudate.  EOMI, PERRLA. Nose: no drainage or turbinate edema/swelling.  No injection or focal lesion.  Mouth: lips without lesion/swelling.  Oral mucosa pink and moist.  Dentition intact and without obvious caries or gingival swelling.  Oropharynx without erythema, exudate, or swelling.  Neck: supple/nontender.  No LAD, mass, or TM.  Carotid pulses 2+ bilaterally, without bruits. CV: RRR, no m/r/g.   LUNGS: CTA bilat, nonlabored resps, good aeration in all lung fields. ABD: soft, NT, ND, BS normal.  No hepatospenomegaly or mass.  No bruits. EXT: no clubbing, cyanosis, or edema.  Musculoskeletal: no joint swelling, erythema, warmth, or tenderness.  ROM of all joints intact. Skin - no sores or suspicious lesions or rashes or color changes  Pertinent labs:  Lab Results  Component Value Date   TSH 3.97 04/27/2019   Lab Results  Component Value Date   WBC 3.9 04/27/2019   HGB 15.0 04/27/2019   HCT 44.7 04/27/2019   MCV 96.8 04/27/2019   PLT 307 04/27/2019   Lab Results  Component Value  Date   CREATININE 1.21 07/13/2019   BUN 16 07/13/2019   NA 134 (L) 07/13/2019   K 4.6 07/13/2019   CL 102 07/13/2019   CO2 29 07/13/2019   Lab Results  Component Value Date   ALT 20 07/13/2019   AST 30 07/13/2019   ALKPHOS 49 07/13/2019   BILITOT 0.6 07/13/2019   Lab Results  Component Value Date   CHOL 220 (H) 07/13/2019   Lab Results  Component Value Date   HDL 73.30 07/13/2019   Lab Results  Component Value Date   LDLCALC 136 (H) 07/13/2019   Lab Results  Component Value Date   TRIG 55.0 07/13/2019   Lab Results  Component Value Date   CHOLHDL 3 07/13/2019   Lab Results  Component Value Date   PSA 5.03 10/24/2019   PSA 7.3 05/04/2019   PSA 9 08/08/2018   Lab Results  Component Value Date   HGBA1C 5.5 07/13/2019   ASSESSMENT AND PLAN:   Health maintenance exam: Reviewed age and gender appropriate health maintenance issues (prudent diet, regular exercise, health risks of tobacco and excessive alcohol, use of seatbelts, fire alarms in home, use of sunscreen).  Also reviewed age and gender appropriate health screening as well as vaccine recommendations. Vaccines: Prevnar 20-->given today.   Shingrix->given today.  Otherwise UTD. Labs: cbc, cmet, flp, thyroid panel (Hx of subclin hypothyr), PSA. Prostate ca screening: PSAs followed by urology b/c hx of elev PSA. Colon ca screening: [place] polyps 2012 ([place] of [place] recall this yea->referral to GI ordered today. Skin ca screening: last screening 2 yrs ago, pt to arrange appt with his dermatologist.  Hx of vit D and iron def-->he's on supplement of both of these and we'll monitor Hb, iron panel, and vit D level today.  An After Visit Summary was printed and given to the patient.  FOLLOW UP:  Return in about 1 year (around 10/31/2021) for annual CPE (fasting).  Signed:  Crissie Sickles, MD           10/31/2020

## 2020-10-31 NOTE — Patient Instructions (Signed)
Health Maintenance, Male Adopting a healthy lifestyle and getting preventive care are important in promoting health and wellness. Ask your health care provider about: The right schedule for you to have regular tests and exams. Things you can do on your own to prevent diseases and keep yourself healthy. What should I know about diet, weight, and exercise? Eat a healthy diet  Eat a diet that includes plenty of vegetables, fruits, low-fat dairy products, and lean protein. Do not eat a lot of foods that are high in solid fats, added sugars, or sodium.  Maintain a healthy weight Body mass index (BMI) is a measurement that can be used to identify possible weight problems. It estimates body fat based on height and weight. Your health care provider can help determine your BMI and help you achieve or maintain ahealthy weight. Get regular exercise Get regular exercise. This is one of the most important things you can do for your health. Most adults should: Exercise for at least 150 minutes each week. The exercise should increase your heart rate and make you sweat (moderate-intensity exercise). Do strengthening exercises at least twice a week. This is in addition to the moderate-intensity exercise. Spend less time sitting. Even light physical activity can be beneficial. Watch cholesterol and blood lipids Have your blood tested for lipids and cholesterol at 66 years of age, then havethis test every 5 years. You may need to have your cholesterol levels checked more often if: Your lipid or cholesterol levels are high. You are older than 66 years of age. You are at high risk for heart disease. What should I know about cancer screening? Many types of cancers can be detected early and may often be prevented. Depending on your health history and family history, you may need to have cancer screening at various ages. This may include screening for: Colorectal cancer. Prostate cancer. Skin cancer. Lung  cancer. What should I know about heart disease, diabetes, and high blood pressure? Blood pressure and heart disease High blood pressure causes heart disease and increases the risk of stroke. This is more likely to develop in people who have high blood pressure readings, are of African descent, or are overweight. Talk with your health care provider about your target blood pressure readings. Have your blood pressure checked: Every 3-5 years if you are 18-39 years of age. Every year if you are 40 years old or older. If you are between the ages of 65 and 75 and are a current or former smoker, ask your health care provider if you should have a one-time screening for abdominal aortic aneurysm (AAA). Diabetes Have regular diabetes screenings. This checks your fasting blood sugar level. Have the screening done: Once every three years after age 45 if you are at a normal weight and have a low risk for diabetes. More often and at a younger age if you are overweight or have a high risk for diabetes. What should I know about preventing infection? Hepatitis B If you have a higher risk for hepatitis B, you should be screened for this virus. Talk with your health care provider to find out if you are at risk forhepatitis B infection. Hepatitis C Blood testing is recommended for: Everyone born from 1945 through 1965. Anyone with known risk factors for hepatitis C. Sexually transmitted infections (STIs) You should be screened each year for STIs, including gonorrhea and chlamydia, if: You are sexually active and are younger than 66 years of age. You are older than 66 years of age   and your health care provider tells you that you are at risk for this type of infection. Your sexual activity has changed since you were last screened, and you are at increased risk for chlamydia or gonorrhea. Ask your health care provider if you are at risk. Ask your health care provider about whether you are at high risk for HIV.  Your health care provider may recommend a prescription medicine to help prevent HIV infection. If you choose to take medicine to prevent HIV, you should first get tested for HIV. You should then be tested every 3 months for as long as you are taking the medicine. Follow these instructions at home: Lifestyle Do not use any products that contain nicotine or tobacco, such as cigarettes, e-cigarettes, and chewing tobacco. If you need help quitting, ask your health care provider. Do not use street drugs. Do not share needles. Ask your health care provider for help if you need support or information about quitting drugs. Alcohol use Do not drink alcohol if your health care provider tells you not to drink. If you drink alcohol: Limit how much you have to 0-2 drinks a day. Be aware of how much alcohol is in your drink. In the [place] one drink equals one 12 oz bottle of beer (355 mL), one 5 oz glass of wine (148 mL), or one 1 oz glass of hard liquor (44 mL). General instructions Schedule regular health, dental, and eye exams. Stay current with your vaccines. Tell your health care provider if: You often feel depressed. You have ever been abused or do not feel safe at home. Summary Adopting a healthy lifestyle and getting preventive care are important in promoting health and wellness. Follow your health care provider's instructions about healthy diet, exercising, and getting tested or screened for diseases. Follow your health care provider's instructions on monitoring your cholesterol and blood pressure. This information is not intended to replace advice given to you by your health care provider. Make sure you discuss any questions you have with your healthcare provider. Document Revised: 03/23/2018 Document Reviewed: 03/23/2018 Elsevier Patient Education  2022 Elsevier Inc.  

## 2020-11-01 LAB — T3: T3, Total: 96 ng/dL (ref 76–181)

## 2020-11-01 LAB — IRON,TIBC AND FERRITIN PANEL
%SAT: 29 % (calc) (ref 20–48)
Ferritin: 110 ng/mL (ref 24–380)
Iron: 100 ug/dL (ref 50–180)
TIBC: 346 mcg/dL (calc) (ref 250–425)

## 2020-11-04 MED ORDER — ATORVASTATIN CALCIUM 20 MG PO TABS: 20.0000 mg | ORAL_TABLET | Freq: Every day | ORAL | 2 refills | Status: DC

## 2020-11-04 NOTE — Addendum Note (Signed)
Addended by: Octaviano Glow on: 11/04/2020 08:35 AM   Modules accepted: Orders

## 2020-11-09 IMAGING — CT CT CHEST W/ CM
2 of 4 series · 11 of 36 positions shown, 13 images · IV contrast (iopamidol)
Comparison: Plain film from 04/27/2019

CLINICAL DATA: Follow-up pulmonary nodule

EXAM:
CT CHEST WITH CONTRAST
TECHNIQUE: Multidetector CT imaging of the chest was performed during
intravenous contrast administration.
CONTRAST:  75mL 0HV4OR-R11 IOPAMIDOL (0HV4OR-R11) INJECTION 61%

[Series 2: chest 2.00 br40 s3 · axial · 0.61mm/px · z∈[+1533,+1819]mm · 8 of 171 slices shown, 10 images (1 of 2)]
[im 14/171  mediastinal]
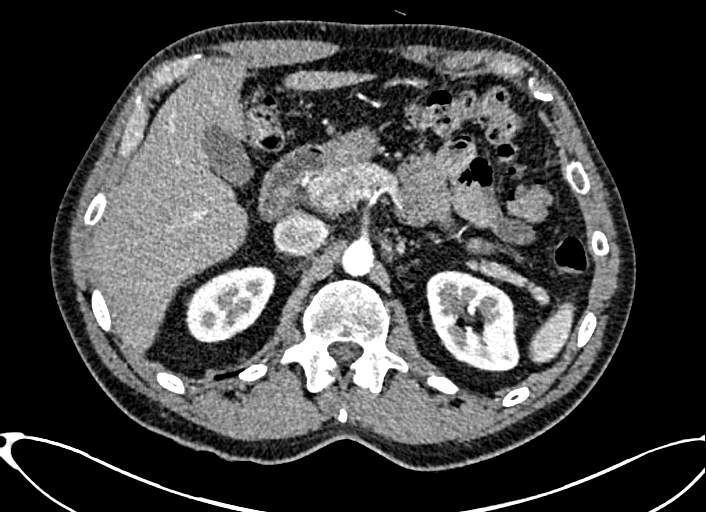
[im 14/171  lung]
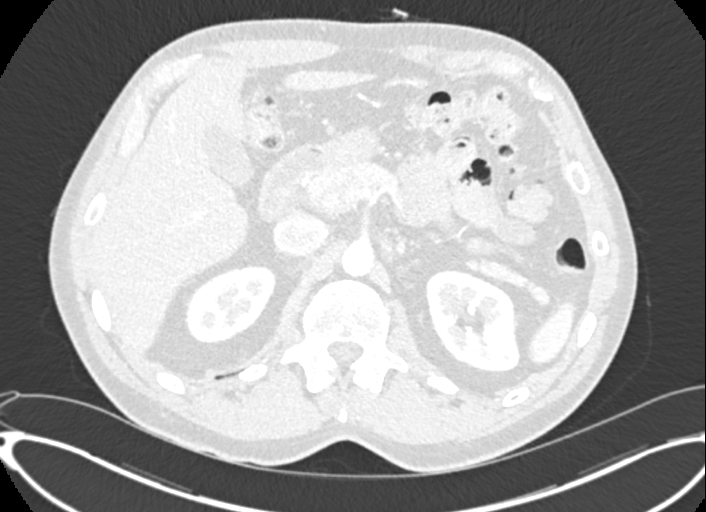
[im 40/171  lung]
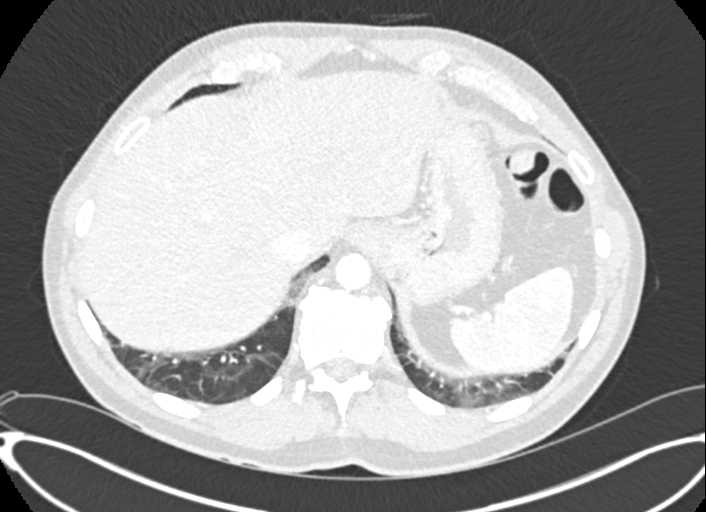
[im 53/171  lung]
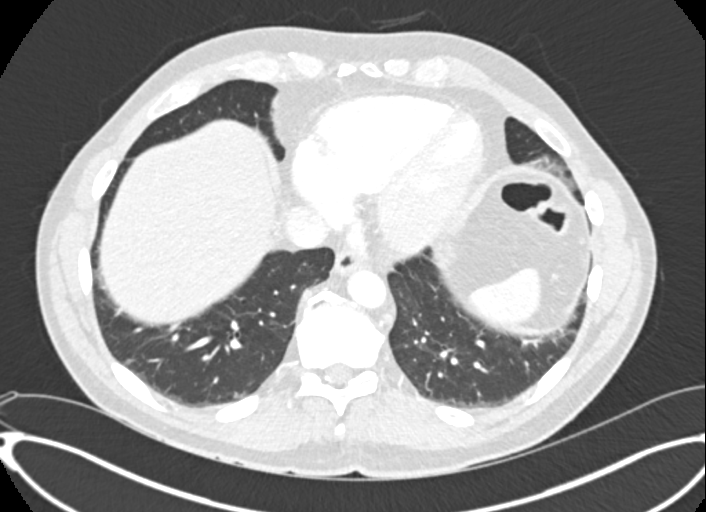
[im 79/171  lung]
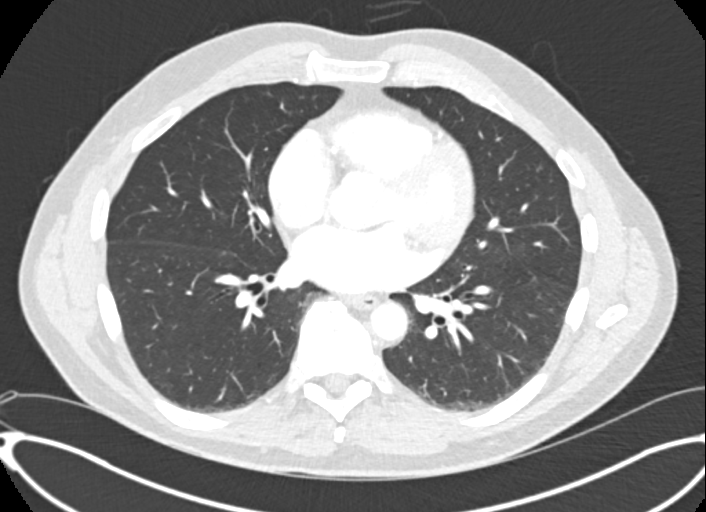
[im 92/171  mediastinal]
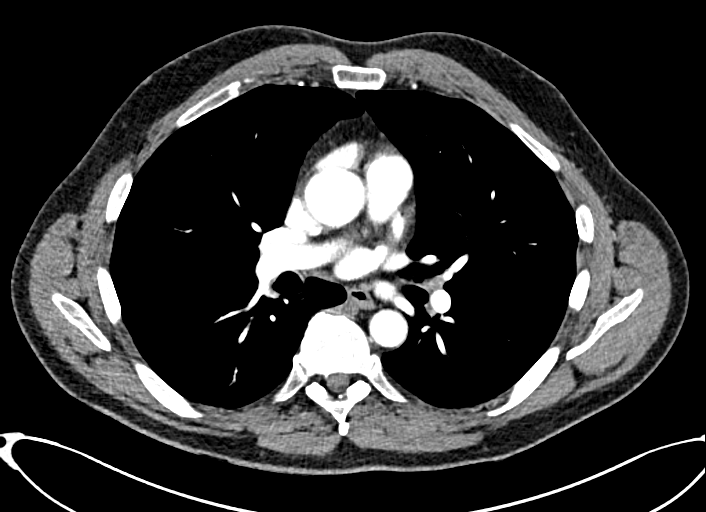
[im 92/171  lung]
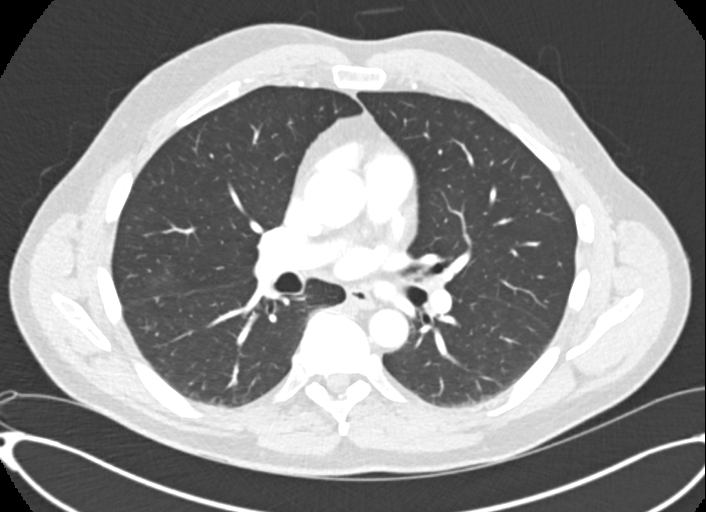
[im 118/171  lung]
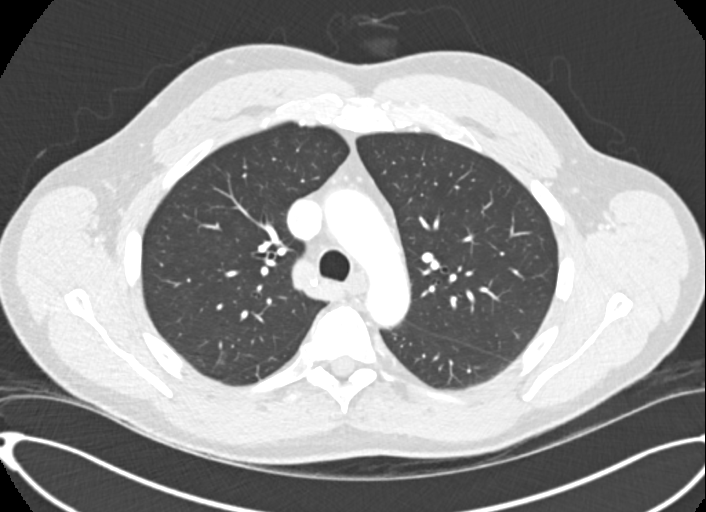
[im 131/171  lung]
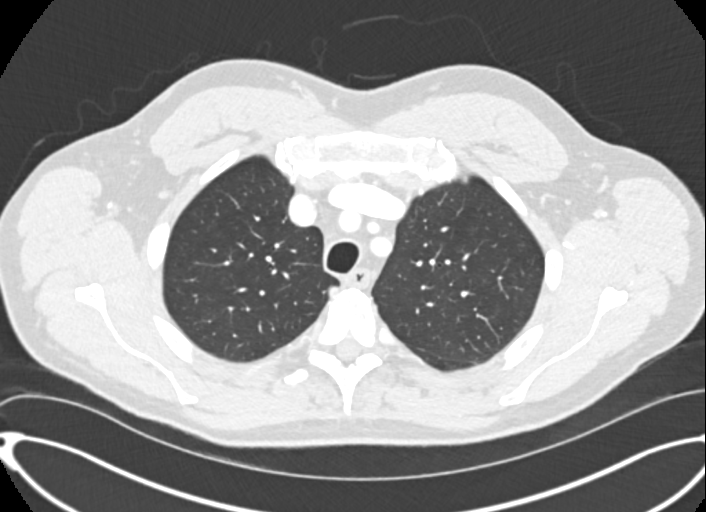
[im 157/171  lung]
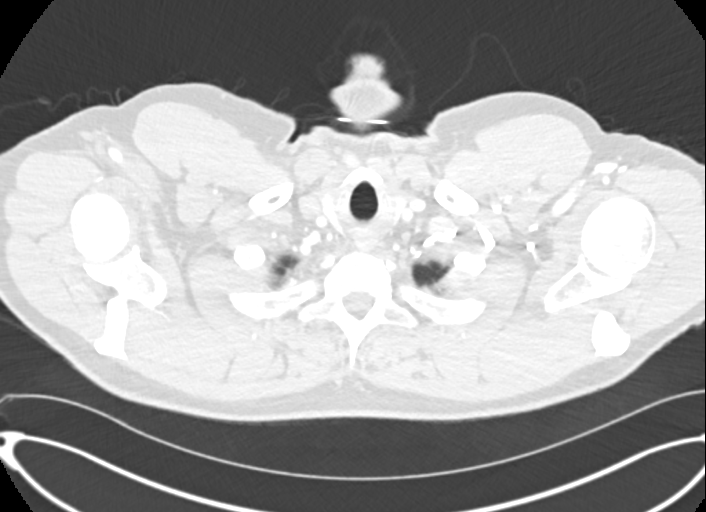

[Series 4: chest 2.00 br40 s3 · coronal · 0.67mm/px · 3 of 155 slices shown (2 of 2)]
[im 31/155  lung]
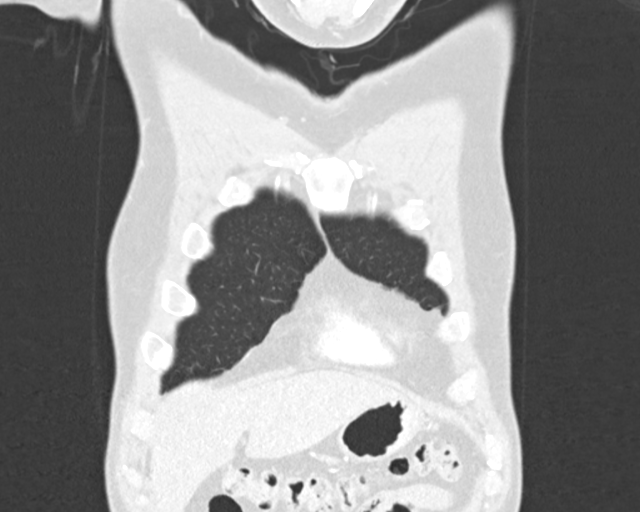
[im 62/155  lung]
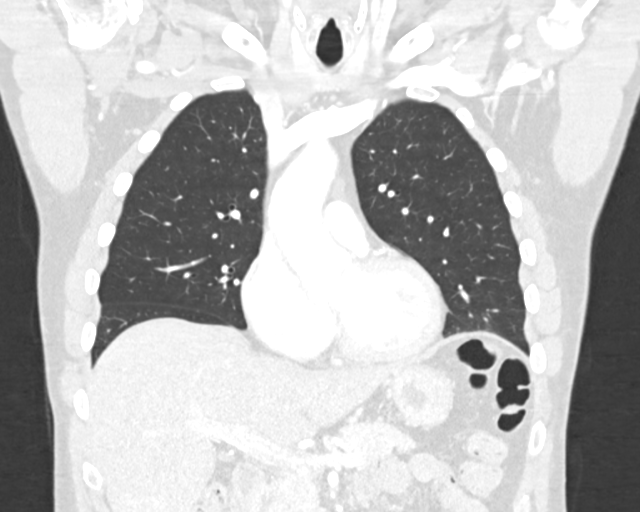
[im 93/155  lung]
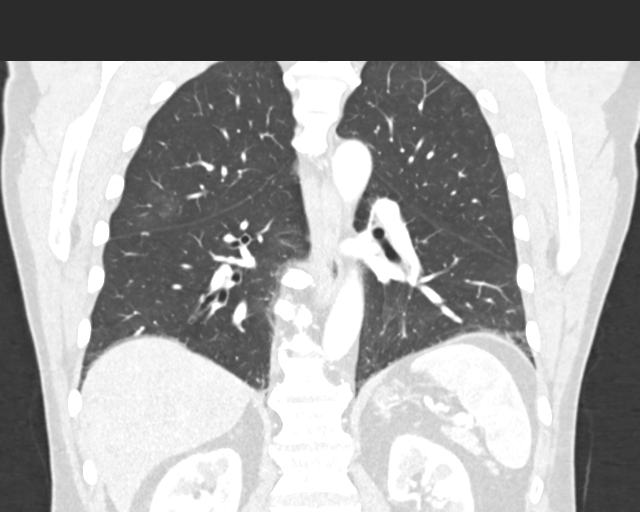

[11 of 36 positions shown; findings below may reference images not displayed]

FINDINGS: Cardiovascular: Thoracic aorta demonstrates mild atherosclerotic
calcifications. Normal branching pattern is seen. No aneurysmal
dilatation or dissection is noted. Pulmonary artery as visualized is
within normal limits. No pulmonary emboli are seen. Mild coronary
calcifications are noted. No cardiac enlargement is seen.

Mediastinum/Nodes: Thoracic inlet is within normal limits with the
exception of a small 8 mm hypodense nodule within the right lobe of
the thyroid. The esophagus as visualized is within normal limits. No
sizable hilar or mediastinal adenopathy is noted.

Lungs/Pleura: The lungs are well aerated bilaterally. No focal
infiltrate or sizable effusion is seen. There is a right lower lobe
nodule identified measuring 5 mm best seen on image number 95 of
series 8. This does not however correspond to the nodular density
seen on recent chest x-ray. This is related to an endplate
calcification identified at T11 on the left. Minimal dependent
atelectatic changes are seen bilaterally. No other definitive
nodules are noted.

Upper Abdomen: Visualized upper abdomen demonstrates mild fatty
infiltration of the liver. No other focal abnormality is seen.

Musculoskeletal: Degenerative changes of the thoracic spine are
seen. No acute bony abnormality is noted.
IMPRESSION: The nodular density seen on recent chest x-ray corresponds to
endplate calcifications inferiorly on the left at T11.

There is however a 5 mm nodule within the right lower lobe as
described. No other sizable nodules are seen. No follow-up needed if
patient is low-risk. Non-contrast chest CT can be considered in 12
months if patient is high-risk. This recommendation follows the
consensus statement: Guidelines for Management of Incidental
Pulmonary Nodules Detected on CT Images: From the [HOSPITAL]

8 mm hypodense nodule within the right lobe of the thyroid. Given
the patient's age and size of the lesion, no followup is
recommended(ref: [HOSPITAL]. [DATE]): 143-50).

Aortic Atherosclerosis ([id number]-HR3.3).

## 2020-11-13 ENCOUNTER — Encounter: Payer: Self-pay | Admitting: Family Medicine

## 2020-11-14 DIAGNOSIS — R972 Elevated prostate specific antigen [PSA]: Secondary | ICD-10-CM | POA: Diagnosis not present

## 2020-11-14 LAB — PSA: PSA: 4.88

## 2020-11-28 DIAGNOSIS — R972 Elevated prostate specific antigen [PSA]: Secondary | ICD-10-CM | POA: Diagnosis not present

## 2020-11-28 DIAGNOSIS — R351 Nocturia: Secondary | ICD-10-CM | POA: Diagnosis not present

## 2020-11-28 DIAGNOSIS — N401 Enlarged prostate with lower urinary tract symptoms: Secondary | ICD-10-CM | POA: Diagnosis not present

## 2021-01-02 ENCOUNTER — Ambulatory Visit (INDEPENDENT_AMBULATORY_CARE_PROVIDER_SITE_OTHER): Payer: BC Managed Care – PPO

## 2021-01-02 ENCOUNTER — Other Ambulatory Visit: Payer: Self-pay

## 2021-01-02 DIAGNOSIS — Z23 Encounter for immunization: Secondary | ICD-10-CM

## 2021-01-03 ENCOUNTER — Encounter: Payer: Self-pay | Admitting: Family Medicine

## 2021-01-03 MED ORDER — PRAVASTATIN SODIUM 20 MG PO TABS
20.0000 mg | ORAL_TABLET | Freq: Every day | ORAL | 3 refills | Status: DC
Start: 2021-01-03 — End: 2021-02-28

## 2021-01-03 NOTE — Telephone Encounter (Signed)
Ask pt to clarify what side effects he feels so this can be added to his med intolerance list. D/c atorvastatin. Lets try pravastatin 20mg , 1 qd, #30, RF x 3. Take every other day at first and if tolerating well after a couple weeks then start taking daily.-thx

## 2021-01-13 ENCOUNTER — Other Ambulatory Visit: Payer: Self-pay | Admitting: Urology

## 2021-01-16 ENCOUNTER — Other Ambulatory Visit: Payer: Self-pay | Admitting: Urology

## 2021-01-30 ENCOUNTER — Ambulatory Visit: Payer: BC Managed Care – PPO

## 2021-02-24 ENCOUNTER — Ambulatory Visit: Payer: BC Managed Care – PPO

## 2021-02-27 ENCOUNTER — Ambulatory Visit (INDEPENDENT_AMBULATORY_CARE_PROVIDER_SITE_OTHER): Payer: BC Managed Care – PPO

## 2021-02-27 ENCOUNTER — Other Ambulatory Visit: Payer: Self-pay

## 2021-02-27 DIAGNOSIS — E7800 Pure hypercholesterolemia, unspecified: Secondary | ICD-10-CM | POA: Diagnosis not present

## 2021-02-27 LAB — LIPID PANEL
Cholesterol: 209 mg/dL — ABNORMAL HIGH (ref 0–200)
HDL: 70.8 mg/dL (ref >39.00)
LDL Cholesterol: 126 mg/dL — ABNORMAL HIGH (ref 0–99)
NonHDL: 137.77
Total CHOL/HDL Ratio: 3
Triglycerides: 58 mg/dL (ref 0.0–149.0)
VLDL: 11.6 mg/dL (ref 0.0–40.0)

## 2021-02-28 ENCOUNTER — Telehealth: Payer: Self-pay

## 2021-02-28 ENCOUNTER — Other Ambulatory Visit: Payer: Self-pay

## 2021-02-28 ENCOUNTER — Other Ambulatory Visit (INDEPENDENT_AMBULATORY_CARE_PROVIDER_SITE_OTHER): Payer: BC Managed Care – PPO

## 2021-02-28 DIAGNOSIS — E7800 Pure hypercholesterolemia, unspecified: Secondary | ICD-10-CM

## 2021-02-28 LAB — ALT: ALT: 26 U/L (ref 0–53)

## 2021-02-28 LAB — AST: AST: 31 U/L (ref 0–37)

## 2021-02-28 MED ORDER — PRAVASTATIN SODIUM 40 MG PO TABS: 40.0000 mg | ORAL_TABLET | Freq: Every day | ORAL | 2 refills | Status: DC

## 2021-02-28 NOTE — Telephone Encounter (Signed)
-----   Message from Tammi Sou, MD sent at 02/28/2021  2:14 PM EST ----- Cholesterol level only minimally improved on atorvastatin 20mg  Pls eRx atorvastatin 40mg , 1 tab po qd, #30, RF x 2.  Lipid panel 2-3 mo, dx hypercholesterolemia.

## 2021-06-03 ENCOUNTER — Other Ambulatory Visit: Payer: Self-pay

## 2021-06-03 ENCOUNTER — Ambulatory Visit (INDEPENDENT_AMBULATORY_CARE_PROVIDER_SITE_OTHER): Payer: BC Managed Care – PPO

## 2021-06-03 DIAGNOSIS — E7800 Pure hypercholesterolemia, unspecified: Secondary | ICD-10-CM | POA: Diagnosis not present

## 2021-06-03 LAB — LIPID PANEL
Cholesterol: 212 mg/dL — ABNORMAL HIGH (ref 0–200)
HDL: 71.6 mg/dL (ref >39.00)
LDL Cholesterol: 128 mg/dL — ABNORMAL HIGH (ref 0–99)
NonHDL: 140.08
Total CHOL/HDL Ratio: 3
Triglycerides: 62 mg/dL (ref 0.0–149.0)
VLDL: 12.4 mg/dL (ref 0.0–40.0)

## 2021-06-04 ENCOUNTER — Encounter: Payer: Self-pay | Admitting: Family Medicine

## 2021-06-04 MED ORDER — PRAVASTATIN SODIUM 80 MG PO TABS: 80.0000 mg | ORAL_TABLET | Freq: Every day | ORAL | 2 refills | Status: DC

## 2021-06-04 NOTE — Addendum Note (Signed)
Addended by: Octaviano Glow on: 06/04/2021 09:32 AM   Modules accepted: Orders

## 2021-08-14 ENCOUNTER — Other Ambulatory Visit: Payer: Self-pay | Admitting: Urology

## 2021-09-01 ENCOUNTER — Ambulatory Visit: Payer: BC Managed Care – PPO

## 2021-09-12 ENCOUNTER — Ambulatory Visit (INDEPENDENT_AMBULATORY_CARE_PROVIDER_SITE_OTHER): Payer: BC Managed Care – PPO

## 2021-09-12 DIAGNOSIS — E7800 Pure hypercholesterolemia, unspecified: Secondary | ICD-10-CM | POA: Diagnosis not present

## 2021-09-12 LAB — LIPID PANEL
Cholesterol: 224 mg/dL — ABNORMAL HIGH (ref 0–200)
HDL: 80.5 mg/dL (ref >39.00)
LDL Cholesterol: 129 mg/dL — ABNORMAL HIGH (ref 0–99)
NonHDL: 143.29
Total CHOL/HDL Ratio: 3
Triglycerides: 72 mg/dL (ref 0.0–149.0)
VLDL: 14.4 mg/dL (ref 0.0–40.0)

## 2021-09-12 LAB — HEPATIC FUNCTION PANEL
ALT: 27 U/L (ref 0–53)
AST: 33 U/L (ref 0–37)
Albumin: 4.5 g/dL (ref 3.5–5.2)
Alkaline Phosphatase: 53 U/L (ref 39–117)
Bilirubin, Direct: 0.1 mg/dL (ref 0.0–0.3)
Total Bilirubin: 1 mg/dL (ref 0.2–1.2)
Total Protein: 7.5 g/dL (ref 6.0–8.3)

## 2021-09-15 ENCOUNTER — Telehealth: Payer: Self-pay

## 2021-09-15 MED ORDER — EZETIMIBE 10 MG PO TABS: 10.0000 mg | ORAL_TABLET | Freq: Every day | ORAL | 2 refills | Status: DC

## 2021-09-15 NOTE — Telephone Encounter (Signed)
-----   Message from Tammi Sou, MD sent at 09/15/2021 12:16 PM EDT ----- Ok pls rx generic zetia, 1 qd, #30, RF x 2.

## 2021-09-17 DIAGNOSIS — G4733 Obstructive sleep apnea (adult) (pediatric): Secondary | ICD-10-CM | POA: Diagnosis not present

## 2021-09-22 ENCOUNTER — Telehealth: Payer: Self-pay

## 2021-09-22 NOTE — Telephone Encounter (Signed)
Christopher Banks (Key: [id number]) - [id number] Ezetimibe '10MG'$  tablets Status: PA Response - Approved Sent: June 12th, 2023

## 2021-10-17 DIAGNOSIS — G4733 Obstructive sleep apnea (adult) (pediatric): Secondary | ICD-10-CM | POA: Diagnosis not present

## 2021-11-03 ENCOUNTER — Ambulatory Visit (INDEPENDENT_AMBULATORY_CARE_PROVIDER_SITE_OTHER): Payer: BC Managed Care – PPO | Admitting: Family Medicine

## 2021-11-03 ENCOUNTER — Encounter: Payer: Self-pay | Admitting: Family Medicine

## 2021-11-03 VITALS — BP 116/75 | HR 63 | Temp 98.0°F | Ht 69.5 in | Wt 194.2 lb

## 2021-11-03 DIAGNOSIS — Z1211 Encounter for screening for malignant neoplasm of colon: Secondary | ICD-10-CM

## 2021-11-03 DIAGNOSIS — E038 Other specified hypothyroidism: Secondary | ICD-10-CM | POA: Diagnosis not present

## 2021-11-03 DIAGNOSIS — E7800 Pure hypercholesterolemia, unspecified: Secondary | ICD-10-CM

## 2021-11-03 DIAGNOSIS — Z125 Encounter for screening for malignant neoplasm of prostate: Secondary | ICD-10-CM

## 2021-11-03 DIAGNOSIS — Z0001 Encounter for general adult medical examination with abnormal findings: Secondary | ICD-10-CM | POA: Diagnosis not present

## 2021-11-03 DIAGNOSIS — E559 Vitamin D deficiency, unspecified: Secondary | ICD-10-CM

## 2021-11-03 DIAGNOSIS — Z0000 Encounter for general adult medical examination without abnormal findings: Secondary | ICD-10-CM

## 2021-11-03 DIAGNOSIS — R0609 Other forms of dyspnea: Secondary | ICD-10-CM

## 2021-11-03 LAB — CBC
HCT: 41.5 % (ref 39.0–52.0)
Hemoglobin: 14 g/dL (ref 13.0–17.0)
MCHC: 33.7 g/dL (ref 30.0–36.0)
MCV: 98.6 fl (ref 78.0–100.0)
Platelets: 310 10*3/uL (ref 150.0–400.0)
RBC: 4.21 Mil/uL — ABNORMAL LOW (ref 4.22–5.81)
RDW: 13.6 % (ref 11.5–15.5)
WBC: 4 10*3/uL (ref 4.0–10.5)

## 2021-11-03 LAB — LIPID PANEL
Cholesterol: 203 mg/dL — ABNORMAL HIGH (ref 0–200)
HDL: 80.5 mg/dL (ref >39.00)
LDL Cholesterol: 112 mg/dL — ABNORMAL HIGH (ref 0–99)
NonHDL: 122.08
Total CHOL/HDL Ratio: 3
Triglycerides: 48 mg/dL (ref 0.0–149.0)
VLDL: 9.6 mg/dL (ref 0.0–40.0)

## 2021-11-03 LAB — COMPREHENSIVE METABOLIC PANEL
ALT: 22 U/L (ref 0–53)
AST: 28 U/L (ref 0–37)
Albumin: 4.4 g/dL (ref 3.5–5.2)
Alkaline Phosphatase: 44 U/L (ref 39–117)
BUN: 14 mg/dL (ref 6–23)
CO2: 27 mEq/L (ref 19–32)
Calcium: 9.5 mg/dL (ref 8.4–10.5)
Chloride: 104 mEq/L (ref 96–112)
Creatinine, Ser: 1.06 mg/dL (ref 0.40–1.50)
GFR: 73.09 mL/min (ref >60.00)
Glucose, Bld: 116 mg/dL — ABNORMAL HIGH (ref 70–99)
Potassium: 4.4 mEq/L (ref 3.5–5.1)
Sodium: 137 mEq/L (ref 135–145)
Total Bilirubin: 0.9 mg/dL (ref 0.2–1.2)
Total Protein: 6.7 g/dL (ref 6.0–8.3)

## 2021-11-03 LAB — TSH: TSH: 3.08 u[IU]/mL (ref 0.35–5.50)

## 2021-11-03 LAB — VITAMIN D 25 HYDROXY (VIT D DEFICIENCY, FRACTURES): VITD: 42.16 ng/mL (ref 30.00–100.00)

## 2021-11-03 LAB — PSA, MEDICARE: PSA: 5.08 ng/ml — ABNORMAL HIGH (ref 0.10–4.00)

## 2021-11-03 MED ORDER — EZETIMIBE 10 MG PO TABS: 10.0000 mg | ORAL_TABLET | Freq: Every day | ORAL | 3 refills | Status: DC

## 2021-11-03 NOTE — Progress Notes (Signed)
Office Note 11/03/2021  CC:  Chief Complaint  Patient presents with   Annual Exam    Pt is fasting    HPI:  Patient is a 66 y.o. male who is here for annual health maintenance exam and to discuss dyspnea on exertion. A/P as of last visit: "Health maintenance exam: Reviewed age and gender appropriate health maintenance issues (prudent diet, regular exercise, health risks of tobacco and excessive alcohol, use of seatbelts, fire alarms in home, use of sunscreen).  Also reviewed age and gender appropriate health screening as well as vaccine recommendations. Vaccines: Prevnar 20-->given today.   Shingrix->given today.  Otherwise UTD. Labs: cbc, cmet, flp, thyroid panel (Hx of subclin hypothyr), PSA. Prostate ca screening: PSAs followed by urology b/c hx of elev PSA. Colon ca screening[place] ([place] recall this yea->referral to GI ordered today. Skin ca screening: last screening 2 yrs ago, pt to arrange appt with his dermatologist.   Hx of vit D and iron def-->he's on supplement of both of these and we'll monitor Hb, iron panel, and vit D level today"  INTERIM HX: Overall Christopher Banks feels well. He is concerned though about a mild feeling of increased shortness of breath with walking that he has felt progressed gradually over the last year or so.  He denies chest pain, dizziness, palpitations, arm or jaw pain, or chest heaviness.  He does drink typically 2 glasses of wine every night, sometimes a little more. He does take his Zetia and pravastatin faithfully. He is a never smoker.  Past Medical History:  Diagnosis Date   Allergic rhinitis    dust mites   BPH with obstruction/lower urinary tract symptoms    BPPV (benign paroxysmal positional vertigo)    COVID-19 virus infection 03/2019   prolonged post-covid DOE and HR variability.     Elevated PSA    Prostate bx 09/2018 NORMAL. PSA dec to 7.16 Apr 2019, then 5.14 October 2019.   Elevated TSH    2018, not taking biotin.  2020, same-->T4/T3 normal.   Hyperlipidemia    statin started 2022/2023   OSA (obstructive sleep apnea) 08/2019   sleep study->severe OSA->CPAP recommended   Pulmonary nodule Jan/feb 2021   5 mm R lung; pt low risk for lung ca so NO F/U IMAGING IS INDICATED.    Past Surgical History:  Procedure Laterality Date   COLONOSCOPY  2012   Normal   polysomnogram  08/25/2019   severe OSA->CPAP recommended   PROSTATE BIOPSY  09/2018   NORMAL   ZIO patch  01/26/2020   NORMAL    Family History  Problem Relation Age of Onset   Hearing loss Mother    Hyperlipidemia Mother    Irritable bowel syndrome Sister    Other Brother        elevated PSA   Diabetes Maternal Grandfather     Social History   Socioeconomic History   Marital status: Married    Spouse name: Not on file   Number of children: 2   Years of education: Not on file   Highest education level: Not on file  Occupational History   Occupation: Chief Financial Officer  Tobacco Use   Smoking status: Never   Smokeless tobacco: Never  Vaping Use   Vaping Use: Never used  Substance and Sexual Activity   Alcohol use: Yes    Comment: occasionally   Drug use: Never   Sexual activity: Not on file  Other Topics Concern   Not on file  Social History Narrative   Married, 2 children.   Orig from [place], relocated to [place].   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   No tob.   Alc: 2 glasses of wine per night.   Social Determinants of Health   Financial Resource Strain: Not on file  Food Insecurity: Not on file  Transportation Needs: Not on file  Physical Activity: Not on file  Stress: Not on file  Social Connections: Not on file  Intimate Partner Violence: Not on file    Outpatient Medications Prior to Visit  Medication Sig Dispense Refill   Cholecalciferol (VITAMIN D3) 50 MCG (2000 UT) capsule Take 2,000 Units by mouth daily.     ezetimibe (ZETIA) 10 MG tablet Take 1 tablet (10 mg total) by mouth  daily. 30 tablet 2   ferrous sulfate 325 (65 FE) MG EC tablet Take 325 mg by mouth daily.     fluticasone (FLONASE) 50 MCG/ACT nasal spray INSTILL 2 SPRAYS INTO EACH NOSTRIL EVERY DAY 16 g 1   Multiple Vitamin (MULTIVITAMIN) tablet Take 1 tablet by mouth daily.     silodosin (RAPAFLO) 8 MG CAPS capsule TAKE ONE CAPSULE BY MOUTH DAILY AT BEDTIME 90 capsule 0   pravastatin (PRAVACHOL) 80 MG tablet Take 1 tablet (80 mg total) by mouth daily. 30 tablet 2   No facility-administered medications prior to visit.    Allergies  Allergen Reactions   Atorvastatin     Indigestion, trouble sleeping   Tamsulosin Other (See Comments)    lightheadedness    ROS Review of Systems  Constitutional:  Negative for appetite change, chills, fatigue and fever.  HENT:  Negative for congestion, dental problem, ear pain and sore throat.   Eyes:  Negative for discharge, redness and visual disturbance.  Respiratory:  Positive for shortness of breath. Negative for cough, chest tightness and wheezing.   Cardiovascular:  Negative for chest pain, palpitations and leg swelling.  Gastrointestinal:  Negative for abdominal pain, blood in stool, diarrhea, nausea and vomiting.  Genitourinary:  Negative for difficulty urinating, dysuria, flank pain, frequency, hematuria and urgency.  Musculoskeletal:  Negative for arthralgias, back pain, joint swelling, myalgias and neck stiffness.  Skin:  Negative for pallor and rash.  Neurological:  Negative for dizziness, speech difficulty, weakness and headaches.  Hematological:  Negative for adenopathy. Does not bruise/bleed easily.  Psychiatric/Behavioral:  Negative for confusion and sleep disturbance. The patient is not nervous/anxious.     PE;    11/03/2021    7:58 AM 10/31/2020    8:05 AM 01/19/2020    3:41 PM  Vitals with BMI  Height 5' 9.5" 5' 9.5" 5' 9.5"  Weight 194 lbs 3 oz 190 lbs 190 lbs 13 oz  BMI 28.28 05.39 76.73  Systolic 419 379 024  Diastolic 75 61 55  Pulse  63 56 60    Gen: Alert, well appearing.  Patient is oriented to person, place, time, and situation. AFFECT: pleasant, lucid thought and speech. ENT: Ears: EACs clear, normal epithelium.  TMs with good light reflex and landmarks bilaterally.  Eyes: no injection, icteris, swelling, or exudate.  EOMI, PERRLA. Nose: no drainage or turbinate edema/swelling.  No injection or focal lesion.  Mouth: lips without lesion/swelling.  Oral mucosa pink and moist.  Dentition intact and without obvious caries or gingival swelling.  Oropharynx without erythema, exudate, or swelling.  Neck: supple/nontender.  No LAD, mass, or TM.  Carotid pulses 2+ bilaterally, without bruits. CV: RRR,  no m/r/g.   LUNGS: CTA bilat, nonlabored resps, good aeration in all lung fields. ABD: soft, NT, ND, BS normal.  No hepatospenomegaly or mass.  No bruits. EXT: no clubbing, cyanosis, or edema.  Musculoskeletal: no joint swelling, erythema, warmth, or tenderness.  ROM of all joints intact. Skin - no sores or suspicious lesions or rashes or color changes  Pertinent labs:  Lab Results  Component Value Date   TSH 3.79 10/31/2020   Lab Results  Component Value Date   WBC 3.8 (L) 10/31/2020   HGB 14.1 10/31/2020   HCT 41.6 10/31/2020   MCV 96.6 10/31/2020   PLT 237.0 10/31/2020   Lab Results  Component Value Date   IRON 100 10/31/2020   TIBC 346 10/31/2020   FERRITIN 110 10/31/2020   Lab Results  Component Value Date   CREATININE 1.09 10/31/2020   BUN 13 10/31/2020   NA 135 10/31/2020   K 4.3 10/31/2020   CL 99 10/31/2020   CO2 28 10/31/2020   Lab Results  Component Value Date   ALT 27 09/12/2021   AST 33 09/12/2021   ALKPHOS 53 09/12/2021   BILITOT 1.0 09/12/2021   Lab Results  Component Value Date   CHOL 224 (H) 09/12/2021   Lab Results  Component Value Date   HDL 80.50 09/12/2021   Lab Results  Component Value Date   LDLCALC 129 (H) 09/12/2021   Lab Results  Component Value Date   TRIG 72.0  09/12/2021   Lab Results  Component Value Date   CHOLHDL 3 09/12/2021   Lab Results  Component Value Date   PSA 4.88 11/14/2020   PSA 5.03 10/24/2019   PSA 7.3 05/04/2019   Last vitamin D Lab Results  Component Value Date   VD25OH 37.92 10/31/2020   12 lead EKG today: NSR, rate 61, no ischemic changes, normal intervals and duration, no ectopy.  Compared to prior EKG 01/24/2020 there are no changes.  ASSESSMENT AND PLAN:   #1 dyspnea on exertion. No other symptoms accompany this. EKG today: NORMAL. We will order myocardial perfusion imaging.   2) Health maintenance exam: Reviewed age and gender appropriate health maintenance issues (prudent diet, regular exercise, health risks of tobacco and excessive alcohol, use of seatbelts, fire alarms in home, use of sunscreen).  Also reviewed age and gender appropriate health screening as well as vaccine recommendations. Vaccines: ALL UTD. Labs: cbc, cmet, flp, TSH (subclin hypoth) Prostate ca screening: PSA Colon ca screening: to get colonoscopy in 1 wk.  An After Visit Summary was printed and given to the patient.  FOLLOW UP:  No follow-ups on file.  Signed:  Crissie Sickles, MD           11/03/2021

## 2021-11-03 NOTE — Patient Instructions (Signed)
Health Maintenance, Male Adopting a healthy lifestyle and getting preventive care are important in promoting health and wellness. Ask your health care provider about: The right schedule for you to have regular tests and exams. Things you can do on your own to prevent diseases and keep yourself healthy. What should I know about diet, weight, and exercise? Eat a healthy diet  Eat a diet that includes plenty of vegetables, fruits, low-fat dairy products, and lean protein. Do not eat a lot of foods that are high in solid fats, added sugars, or sodium. Maintain a healthy weight Body mass index (BMI) is a measurement that can be used to identify possible weight problems. It estimates body fat based on height and weight. Your health care provider can help determine your BMI and help you achieve or maintain a healthy weight. Get regular exercise Get regular exercise. This is one of the most important things you can do for your health. Most adults should: Exercise for at least 150 minutes each week. The exercise should increase your heart rate and make you sweat (moderate-intensity exercise). Do strengthening exercises at least twice a week. This is in addition to the moderate-intensity exercise. Spend less time sitting. Even light physical activity can be beneficial. Watch cholesterol and blood lipids Have your blood tested for lipids and cholesterol at 67 years of age, then have this test every 5 years. You may need to have your cholesterol levels checked more often if: Your lipid or cholesterol levels are high. You are older than 67 years of age. You are at high risk for heart disease. What should I know about cancer screening? Many types of cancers can be detected early and may often be prevented. Depending on your health history and family history, you may need to have cancer screening at various ages. This may include screening for: Colorectal cancer. Prostate cancer. Skin cancer. Lung  cancer. What should I know about heart disease, diabetes, and high blood pressure? Blood pressure and heart disease High blood pressure causes heart disease and increases the risk of stroke. This is more likely to develop in people who have high blood pressure readings or are overweight. Talk with your health care provider about your target blood pressure readings. Have your blood pressure checked: Every 3-5 years if you are 18-39 years of age. Every year if you are 40 years old or older. If you are between the ages of 65 and 75 and are a current or former smoker, ask your health care provider if you should have a one-time screening for abdominal aortic aneurysm (AAA). Diabetes Have regular diabetes screenings. This checks your fasting blood sugar level. Have the screening done: Once every three years after age 45 if you are at a normal weight and have a low risk for diabetes. More often and at a younger age if you are overweight or have a high risk for diabetes. What should I know about preventing infection? Hepatitis B If you have a higher risk for hepatitis B, you should be screened for this virus. Talk with your health care provider to find out if you are at risk for hepatitis B infection. Hepatitis C Blood testing is recommended for: Everyone born from 1945 through 1965. Anyone with known risk factors for hepatitis C. Sexually transmitted infections (STIs) You should be screened each year for STIs, including gonorrhea and chlamydia, if: You are sexually active and are younger than 67 years of age. You are older than 67 years of age and your   health care provider tells you that you are at risk for this type of infection. Your sexual activity has changed since you were last screened, and you are at increased risk for chlamydia or gonorrhea. Ask your health care provider if you are at risk. Ask your health care provider about whether you are at high risk for HIV. Your health care provider  may recommend a prescription medicine to help prevent HIV infection. If you choose to take medicine to prevent HIV, you should first get tested for HIV. You should then be tested every 3 months for as long as you are taking the medicine. Follow these instructions at home: Alcohol use Do not drink alcohol if your health care provider tells you not to drink. If you drink alcohol: Limit how much you have to 0-2 drinks a day. Know how much alcohol is in your drink. In the [place] one drink equals one 12 oz bottle of beer (355 mL), one 5 oz glass of wine (148 mL), or one 1 oz glass of hard liquor (44 mL). Lifestyle Do not use any products that contain nicotine or tobacco. These products include cigarettes, chewing tobacco, and vaping devices, such as e-cigarettes. If you need help quitting, ask your health care provider. Do not use street drugs. Do not share needles. Ask your health care provider for help if you need support or information about quitting drugs. General instructions Schedule regular health, dental, and eye exams. Stay current with your vaccines. Tell your health care provider if: You often feel depressed. You have ever been abused or do not feel safe at home. Summary Adopting a healthy lifestyle and getting preventive care are important in promoting health and wellness. Follow your health care provider's instructions about healthy diet, exercising, and getting tested or screened for diseases. Follow your health care provider's instructions on monitoring your cholesterol and blood pressure. This information is not intended to replace advice given to you by your health care provider. Make sure you discuss any questions you have with your health care provider. Document Revised: 08/19/2020 Document Reviewed: 08/19/2020 Elsevier Patient Education  2023 Elsevier Inc.  

## 2021-11-04 ENCOUNTER — Other Ambulatory Visit (INDEPENDENT_AMBULATORY_CARE_PROVIDER_SITE_OTHER): Payer: BC Managed Care – PPO

## 2021-11-04 DIAGNOSIS — R7301 Impaired fasting glucose: Secondary | ICD-10-CM | POA: Diagnosis not present

## 2021-11-04 LAB — HEMOGLOBIN A1C: Hgb A1c MFr Bld: 5.7 % (ref 4.6–6.5)

## 2021-11-05 ENCOUNTER — Encounter: Payer: Self-pay | Admitting: Family Medicine

## 2021-11-10 DIAGNOSIS — K648 Other hemorrhoids: Secondary | ICD-10-CM | POA: Diagnosis not present

## 2021-11-10 DIAGNOSIS — D123 Benign neoplasm of transverse colon: Secondary | ICD-10-CM | POA: Diagnosis not present

## 2021-11-10 DIAGNOSIS — Z1211 Encounter for screening for malignant neoplasm of colon: Secondary | ICD-10-CM | POA: Diagnosis not present

## 2021-11-10 LAB — HM COLONOSCOPY

## 2021-11-17 DIAGNOSIS — G4733 Obstructive sleep apnea (adult) (pediatric): Secondary | ICD-10-CM | POA: Diagnosis not present

## 2021-11-28 DIAGNOSIS — R351 Nocturia: Secondary | ICD-10-CM | POA: Diagnosis not present

## 2021-11-28 DIAGNOSIS — N401 Enlarged prostate with lower urinary tract symptoms: Secondary | ICD-10-CM | POA: Diagnosis not present

## 2021-11-28 DIAGNOSIS — R972 Elevated prostate specific antigen [PSA]: Secondary | ICD-10-CM | POA: Diagnosis not present

## 2021-11-28 DIAGNOSIS — N5201 Erectile dysfunction due to arterial insufficiency: Secondary | ICD-10-CM | POA: Diagnosis not present

## 2021-12-01 ENCOUNTER — Telehealth (HOSPITAL_COMMUNITY): Payer: Self-pay

## 2021-12-01 NOTE — Telephone Encounter (Signed)
Attempted to contact the patient, unavailable. S.Joyce Heitman EMTP

## 2021-12-02 ENCOUNTER — Encounter: Payer: Self-pay | Admitting: Family Medicine

## 2021-12-02 ENCOUNTER — Ambulatory Visit (HOSPITAL_COMMUNITY): Payer: BC Managed Care – PPO | Attending: Cardiology

## 2021-12-02 DIAGNOSIS — R0609 Other forms of dyspnea: Secondary | ICD-10-CM | POA: Diagnosis not present

## 2021-12-02 HISTORY — PX: CARDIOVASCULAR STRESS TEST: SHX262

## 2021-12-02 LAB — MYOCARDIAL PERFUSION IMAGING
Angina Index: 0
Duke Treadmill Score: 7
Estimated workload: 8.1
Exercise duration (min): 6 min
Exercise duration (sec): 45 s
LV dias vol: 83 mL (ref 62–150)
LV sys vol: 35 mL
MPHR: 154 {beats}/min
Nuc Stress EF: 58 %
Peak HR: 141 {beats}/min
Percent HR: 91 %
RPE: 19
Rest HR: 51 {beats}/min
Rest Nuclear Isotope Dose: 10.7 mCi
SDS: 0
SRS: 0
SSS: 0
ST Depression (mm): 0 mm
Stress Nuclear Isotope Dose: 32.3 mCi
TID: 0.96

## 2021-12-02 MED ORDER — TECHNETIUM TC 99M TETROFOSMIN IV KIT
10.7000 | PACK | Freq: Once | INTRAVENOUS | Status: AC | PRN
  Administered 2021-12-02: 10.7 via INTRAVENOUS

## 2021-12-02 MED ORDER — REGADENOSON 0.4 MG/5ML IV SOLN: 0.4000 mg | Freq: Once | INTRAVENOUS | Status: DC

## 2021-12-02 MED ORDER — TECHNETIUM TC 99M TETROFOSMIN IV KIT
32.3000 | PACK | Freq: Once | INTRAVENOUS | Status: AC | PRN
  Administered 2021-12-02: 32.3 via INTRAVENOUS

## 2021-12-04 ENCOUNTER — Telehealth: Payer: Self-pay

## 2021-12-04 ENCOUNTER — Encounter: Payer: Self-pay | Admitting: Family Medicine

## 2021-12-04 DIAGNOSIS — R0609 Other forms of dyspnea: Secondary | ICD-10-CM

## 2021-12-04 NOTE — Telephone Encounter (Signed)
I called patient and clarified that I meant to convey that I DO think we can be reassured that the blood flow to his heart is good.    However will continue with the plan to get echo and chest x-ray.

## 2021-12-04 NOTE — Telephone Encounter (Signed)
Message copied from result note. Orders pending, please review and finalize. Patient did not have imaging location preference

## 2021-12-04 NOTE — Telephone Encounter (Signed)
-----   Message from Tammi Sou, MD sent at 12/04/2021 11:58 AM EDT ----- I do not think we can be reassured that his flow to his heart is good, in other words no obstruction. However, I think an appropriate next step to further evaluate his shortness of breath is a chest x-ray and an echocardiogram.  Let him know that there is always the option of seeing the cardiologist at any time and he can just let me know and I will initiate the referral.  I will wait on response before I order anything. Thanks.

## 2021-12-05 NOTE — Telephone Encounter (Signed)
Noted  

## 2021-12-23 ENCOUNTER — Ambulatory Visit (HOSPITAL_BASED_OUTPATIENT_CLINIC_OR_DEPARTMENT_OTHER)
Admission: RE | Admit: 2021-12-23 | Discharge: 2021-12-23 | Disposition: A | Discharge status: Home / Self Care | Payer: BC Managed Care – PPO | Source: Ambulatory Visit | Attending: Family Medicine | Admitting: Family Medicine

## 2021-12-23 DIAGNOSIS — R0609 Other forms of dyspnea: Secondary | ICD-10-CM | POA: Diagnosis not present

## 2021-12-23 LAB — ECHOCARDIOGRAM COMPLETE
AR max vel: 2.2 cm2
AV Area VTI: 2.43 cm2
AV Area mean vel: 2.18 cm2
AV Mean grad: 4 mmHg
AV Peak grad: 6.4 mmHg
Ao pk vel: 1.26 m/s
Area-P 1/2: 3.46 cm2
S' Lateral: 3.3 cm

## 2021-12-23 NOTE — Progress Notes (Signed)
  Echocardiogram 2D Echocardiogram has been performed.  Christopher Banks F 12/23/2021, 8:23 AM

## 2021-12-24 ENCOUNTER — Encounter: Payer: Self-pay | Admitting: Family Medicine

## 2021-12-31 ENCOUNTER — Other Ambulatory Visit: Payer: Self-pay

## 2022-01-06 ENCOUNTER — Other Ambulatory Visit: Payer: Self-pay

## 2022-02-25 ENCOUNTER — Encounter: Payer: Self-pay | Admitting: Family Medicine

## 2022-06-09 ENCOUNTER — Ambulatory Visit (INDEPENDENT_AMBULATORY_CARE_PROVIDER_SITE_OTHER): Payer: Medicare HMO | Admitting: Family Medicine

## 2022-06-09 VITALS — BP 134/78 | HR 47 | Temp 97.5°F | Ht 69.0 in | Wt 179.6 lb

## 2022-06-09 DIAGNOSIS — R202 Paresthesia of skin: Secondary | ICD-10-CM | POA: Diagnosis not present

## 2022-06-09 DIAGNOSIS — X503XXA Overexertion from repetitive movements, initial encounter: Secondary | ICD-10-CM | POA: Diagnosis not present

## 2022-06-09 DIAGNOSIS — M25531 Pain in right wrist: Secondary | ICD-10-CM

## 2022-06-09 NOTE — Progress Notes (Signed)
OFFICE VISIT  06/09/2022  CC:  Chief Complaint  Patient presents with   Wrist Injury    Injury occurred Sat, (R) wrist pain level currently 2/10; took Tylenol, last dose 2d ago. Has limited ROM    Patient is a 67 y.o. male who presents for wrist pain.  HPI: 4 days ago Christopher Banks was working in his yard with a hoe and this is not work he usually does. Towards the end of his work he started feeling pain in his right wrist volar aspect. After he rested for a while the pain went away but he began to feel decreased sensation in the palm of the right hand as well as palmar aspect of digits 1 through 4. Denies weakness or pain in the hand or wrist currently.  No pallor or cyanosis has been noted.  Past Medical History:  Diagnosis Date   Allergic rhinitis    dust mites   BPH with obstruction/lower urinary tract symptoms    BPPV (benign paroxysmal positional vertigo)    COVID-19 virus infection 03/2019   prolonged post-covid DOE and HR variability.     Elevated PSA    Prostate bx 09/2018 NORMAL. PSA dec to 7.16 Apr 2019, then 5.14 October 2019.   Elevated TSH    2018, not taking biotin. 2020, same-->T4/T3 normal.   History of adenomatous polyp of colon    Hyperlipidemia    statin started 2022/2023   OSA (obstructive sleep apnea) 08/2019   sleep study->severe OSA->CPAP recommended   Prediabetes    10/2021 fasting glucose 116, hemoglobin A1c 5.7%   Pulmonary nodule Jan/feb 2021   5 mm R lung; pt low risk for lung ca so NO F/U IMAGING IS INDICATED.    Past Surgical History:  Procedure Laterality Date   CARDIOVASCULAR STRESS TEST  12/02/2021   Myocardial perfusion imaging normal, EF normal.   COLONOSCOPY  2012   2012 Normal.  10/2021 adenoma x 1->recall 5 yrs   polysomnogram  08/25/2019   severe OSA->CPAP recommended   PROSTATE BIOPSY  09/2018   NORMAL   TRANSTHORACIC ECHOCARDIOGRAM     12/23/21 EF 60 to 65%.  Grade 1 diastolic dysfunction.   ZIO patch  01/26/2020   NORMAL    Outpatient  Medications Prior to Visit  Medication Sig Dispense Refill   Cholecalciferol (VITAMIN D3) 50 MCG (2000 UT) capsule Take 2,000 Units by mouth daily.     ferrous sulfate 325 (65 FE) MG EC tablet Take 325 mg by mouth daily.     fluticasone (FLONASE) 50 MCG/ACT nasal spray INSTILL 2 SPRAYS INTO EACH NOSTRIL EVERY DAY 16 g 1   Multiple Vitamin (MULTIVITAMIN) tablet Take 1 tablet by mouth daily.     silodosin (RAPAFLO) 8 MG CAPS capsule TAKE ONE CAPSULE BY MOUTH DAILY AT BEDTIME 90 capsule 0   ezetimibe (ZETIA) 10 MG tablet Take 1 tablet (10 mg total) by mouth daily. (Patient not taking: Reported on 06/09/2022) 90 tablet 3   Facility-Administered Medications Prior to Visit  Medication Dose Route Frequency Provider Last Rate Last Admin   regadenoson (LEXISCAN) injection SOLN 0.4 mg  0.4 mg Intravenous Once Lelon Perla, MD        Allergies  Allergen Reactions   Atorvastatin     Indigestion, trouble sleeping   Tamsulosin Other (See Comments)    lightheadedness    Review of Systems  As per HPI  PE:    06/09/2022   10:10 AM 12/02/2021    7:42 AM 11/03/2021  7:58 AM  Vitals with BMI  Height '5\' 9"'$  '5\' 9"'$  5' 9.5"  Weight 179 lbs 10 oz 194 lbs 194 lbs 3 oz  BMI 26.51 [id number]  Systolic [id number]  [id number]  Diastolic 78  75  Pulse 47  63     Physical Exam  Gen: Alert, well appearing.  Patient is oriented to person, place, time, and situation. Right hand and wrist are pink and without swelling, erythema, or warmth.  Radial and ulnar pulses are strong. Range of motion of the right wrist and fingers fully intact, strength 5 out of 5. Decreased sensation to fine touch on the right hand over the volar aspect of digits 1 through 4 as well as over the palm.  Wrist and forearm sensation fully intact.  LABS:  None  IMPRESSION AND PLAN:  Acute paresthesia/decreased sensation right hand median nerve distribution, recent brief period of overuse-->suspect acute median nerve injury.   However, the  ongoing level of sensory deficit seems out of proportion to the mechanism of injury.  Recommended over-the-counter wrist splint and will ask sports medicine to see him for expert evaluation.  (Bedside MSK ultrasound: Normal-appearing median nerve, without enlargement or surrounding fluid.  Flexor tendons, ulnar nerve, ulnar artery, and radial artery appears normal. No fluid noted in radioulnar or radiocarpal joint spaces.)    An After Visit Summary was printed and given to the patient.  FOLLOW UP: Return if symptoms worsen or fail to improve.  Signed:  Crissie Sickles, MD           06/09/2022

## 2022-06-10 ENCOUNTER — Ambulatory Visit: Payer: Self-pay

## 2022-06-10 ENCOUNTER — Ambulatory Visit: Payer: Medicare HMO | Admitting: Family Medicine

## 2022-06-10 VITALS — BP 118/80 | Ht 69.0 in | Wt 179.0 lb

## 2022-06-10 DIAGNOSIS — G5611 Other lesions of median nerve, right upper limb: Secondary | ICD-10-CM

## 2022-06-10 DIAGNOSIS — M79641 Pain in right hand: Secondary | ICD-10-CM

## 2022-06-10 MED ORDER — PREDNISONE 5 MG PO TABS: ORAL_TABLET | ORAL | 0 refills | Status: DC

## 2022-06-10 NOTE — Progress Notes (Signed)
  Christopher Banks - 67 y.o. male MRN [id number]  Date of birth: 07/09/1955  SUBJECTIVE:  Including CC & ROS.  No chief complaint on file.   Christopher Banks is a 67 y.o. male that is presenting with right hand numbness.  He noticed the pain after he was using a manual weed remover.  Pain has relieved but the numbness is still occurring on the palmar aspect of the first through 4 digits.  No history of similar pain or symptoms.  No history of surgery.    Review of Systems See HPI   HISTORY: Past Medical, Surgical, Social, and Family History Reviewed & Updated per EMR.   Pertinent Historical Findings include:  Past Medical History:  Diagnosis Date   Allergic rhinitis    dust mites   BPH with obstruction/lower urinary tract symptoms    BPPV (benign paroxysmal positional vertigo)    COVID-19 virus infection 03/2019   prolonged post-covid DOE and HR variability.     Elevated PSA    Prostate bx 09/2018 NORMAL. PSA dec to 7.16 Apr 2019, then 5.14 October 2019.   Elevated TSH    2018, not taking biotin. 2020, same-->T4/T3 normal.   History of adenomatous polyp of colon    Hyperlipidemia    statin started 2022/2023   OSA (obstructive sleep apnea) 08/2019   sleep study->severe OSA->CPAP recommended   Prediabetes    10/2021 fasting glucose 116, hemoglobin A1c 5.7%   Pulmonary nodule Jan/feb 2021   5 mm R lung; pt low risk for lung ca so NO F/U IMAGING IS INDICATED.    Past Surgical History:  Procedure Laterality Date   CARDIOVASCULAR STRESS TEST  12/02/2021   Myocardial perfusion imaging normal, EF normal.   COLONOSCOPY  2012   2012 Normal.  10/2021 adenoma x 1->recall 5 yrs   polysomnogram  08/25/2019   severe OSA->CPAP recommended   PROSTATE BIOPSY  09/2018   NORMAL   TRANSTHORACIC ECHOCARDIOGRAM     12/23/21 EF 60 to 65%.  Grade 1 diastolic dysfunction.   ZIO patch  01/26/2020   NORMAL     PHYSICAL EXAM:  VS: BP 118/80 (BP Location: Left Arm, Patient Position: Sitting)   Ht 5'  9" (1.753 m)   Wt 179 lb (81.2 kg)   BMI 26.43 kg/m  Physical Exam Gen: NAD, alert, cooperative with exam, well-appearing MSK:  Neurovascularly intact    Limited ultrasound: Right wrist pain:  Median nerve has hypoechoic change within the nerve in short axis and more evident on the long axis. No significant degenerative changes within the [facility] joint. Mild effusion within the carpal joints.  Summary: Findings consistent with a neuritis of the median nerve  Ultrasound and interpretation by Clearance Coots, MD    ASSESSMENT & PLAN:   Median nerve neuritis, right Acutely occurring.  No chronic change of the median nerve but there does appear to be acute changes based on having swelling within the nerve itself. -Counseled on home exercise therapy and supportive care. -Counseled on bracing. -Prednisone. -Could consider injection.

## 2022-06-10 NOTE — Patient Instructions (Signed)
Nice to meet you Please use the brace while having the numbness  Please try the exercises   Please send me a message in MyChart with any questions or updates.  Please see me back in 3 weeks or as needed if better.   --Dr. Raeford Razor

## 2022-06-10 NOTE — Assessment & Plan Note (Signed)
Acutely occurring.  No chronic change of the median nerve but there does appear to be acute changes based on having swelling within the nerve itself. -Counseled on home exercise therapy and supportive care. -Counseled on bracing. -Prednisone. -Could consider injection.

## 2022-07-21 ENCOUNTER — Ambulatory Visit: Payer: Medicare HMO | Admitting: Family Medicine

## 2022-07-21 ENCOUNTER — Encounter: Payer: Self-pay | Admitting: Family Medicine

## 2022-07-21 ENCOUNTER — Other Ambulatory Visit: Payer: Self-pay

## 2022-07-21 ENCOUNTER — Ambulatory Visit (HOSPITAL_BASED_OUTPATIENT_CLINIC_OR_DEPARTMENT_OTHER)
Admission: RE | Admit: 2022-07-21 | Discharge: 2022-07-21 | Disposition: A | Discharge status: Home / Self Care | Payer: Medicare HMO | Source: Ambulatory Visit | Attending: Family Medicine | Admitting: Family Medicine

## 2022-07-21 VITALS — BP 120/80 | Ht 69.0 in | Wt 179.0 lb

## 2022-07-21 DIAGNOSIS — M25511 Pain in right shoulder: Secondary | ICD-10-CM

## 2022-07-21 DIAGNOSIS — M19011 Primary osteoarthritis, right shoulder: Secondary | ICD-10-CM | POA: Diagnosis not present

## 2022-07-21 DIAGNOSIS — S46811A Strain of other muscles, fascia and tendons at shoulder and upper arm level, right arm, initial encounter: Secondary | ICD-10-CM | POA: Insufficient documentation

## 2022-07-21 MED ORDER — MELOXICAM 15 MG PO TABS: 15.0000 mg | ORAL_TABLET | Freq: Every day | ORAL | 1 refills | Status: DC | PRN

## 2022-07-21 NOTE — Patient Instructions (Signed)
Good to see you Please alternate heat and ice  Please continue range of motion movements  We'll call with the xray results.   We'll get the MRI at [facility] imaging.  Please send me a message in MyChart with any questions or updates.  We'll schedule a virtual visit once the MRI is resulted.   --Dr. Jordan Likes

## 2022-07-21 NOTE — Progress Notes (Signed)
  Christopher Banks - 67 y.o. male MRN [id number]  Date of birth: 1955/04/18  SUBJECTIVE:  Including CC & ROS.  No chief complaint on file.   Christopher Banks is a 67 y.o. male that is presenting with acute right shoulder pain.  He had a trauma to his right shoulder.  He was lifting during a bench press and felt a pop in his shoulder.  Since that time he has had significant weakness and pain.  No history of previous surgery.  He has limited mobility and strength.    Review of Systems See HPI   HISTORY: Past Medical, Surgical, Social, and Family History Reviewed & Updated per EMR.   Pertinent Historical Findings include:  Past Medical History:  Diagnosis Date   Allergic rhinitis    dust mites   BPH with obstruction/lower urinary tract symptoms    BPPV (benign paroxysmal positional vertigo)    COVID-19 virus infection 03/2019   prolonged post-covid DOE and HR variability.     Elevated PSA    Prostate bx 09/2018 NORMAL. PSA dec to 7.16 Apr 2019, then 5.14 October 2019.   Elevated TSH    2018, not taking biotin. 2020, same-->T4/T3 normal.   History of adenomatous polyp of colon    Hyperlipidemia    statin started 2022/2023   OSA (obstructive sleep apnea) 08/2019   sleep study->severe OSA->CPAP recommended   Prediabetes    10/2021 fasting glucose 116, hemoglobin A1c 5.7%   Pulmonary nodule Jan/feb 2021   5 mm R lung; pt low risk for lung ca so NO F/U IMAGING IS INDICATED.    Past Surgical History:  Procedure Laterality Date   CARDIOVASCULAR STRESS TEST  12/02/2021   Myocardial perfusion imaging normal, EF normal.   COLONOSCOPY  2012   2012 Normal.  10/2021 adenoma x 1->recall 5 yrs   polysomnogram  08/25/2019   severe OSA->CPAP recommended   PROSTATE BIOPSY  09/2018   NORMAL   TRANSTHORACIC ECHOCARDIOGRAM     12/23/21 EF 60 to 65%.  Grade 1 diastolic dysfunction.   ZIO patch  01/26/2020   NORMAL     PHYSICAL EXAM:  VS: BP 120/80 (BP Location: Left Arm, Patient Position:  Sitting)   Ht 5\' 9"  (1.753 m)   Wt 179 lb (81.2 kg)   BMI 26.43 kg/m  Physical Exam Gen: NAD, alert, cooperative with exam, well-appearing MSK:  Right shoulder: Limited range of motion. Weakness with resistance to internal and external rotation. Positive empty can test. Positive O'Brien's test Neurovascularly intact    Limited ultrasound: Right shoulder pain:  Bursitis appreciated overlying the biceps tendon. Subscapularis appears to be torn with retraction. Degenerative changes of the supraspinatus with overlying bursitis. Effusion appreciated within the glenohumeral joint. Degenerative changes of the sternoclavicular joint with associated effusion  Summary: Findings consistent with rupture and retraction of subscapularis tendon  Ultrasound and interpretation by Clare Gandy, MD    ASSESSMENT & PLAN:   Rupture of right subscapularis tendon Acutely occurring after recent trauma with lifting.  Now having significant weakness and limited range of motion on exam.  No prior history of rotator cuff injury.  No history of surgery. -Counseled on home exercise therapy and supportive care. - Xray. - MRI of the right shoulder to evaluate for rotator cuff tear and retraction and for presurgical planning.

## 2022-07-21 NOTE — Assessment & Plan Note (Signed)
Acutely occurring after recent trauma with lifting.  Now having significant weakness and limited range of motion on exam.  No prior history of rotator cuff injury.  No history of surgery. -Counseled on home exercise therapy and supportive care. - Xray. - MRI of the right shoulder to evaluate for rotator cuff tear and retraction and for presurgical planning.

## 2022-07-23 ENCOUNTER — Telehealth: Payer: Self-pay | Admitting: Family Medicine

## 2022-07-23 NOTE — Telephone Encounter (Signed)
Informed of results.   Myra Rude, MD [facility] 07/23/2022, 1:32 PM

## 2022-07-27 ENCOUNTER — Encounter: Payer: Self-pay | Admitting: *Deleted

## 2022-08-11 ENCOUNTER — Ambulatory Visit
Admission: RE | Admit: 2022-08-11 | Discharge: 2022-08-11 | Disposition: A | Discharge status: Home / Self Care | Payer: Medicare HMO | Source: Ambulatory Visit | Attending: Family Medicine | Admitting: Family Medicine

## 2022-08-11 DIAGNOSIS — M25511 Pain in right shoulder: Secondary | ICD-10-CM | POA: Diagnosis not present

## 2022-08-11 DIAGNOSIS — S46811A Strain of other muscles, fascia and tendons at shoulder and upper arm level, right arm, initial encounter: Secondary | ICD-10-CM

## 2022-08-18 ENCOUNTER — Telehealth (INDEPENDENT_AMBULATORY_CARE_PROVIDER_SITE_OTHER): Payer: Medicare HMO | Admitting: Family Medicine

## 2022-08-18 DIAGNOSIS — S46811D Strain of other muscles, fascia and tendons at shoulder and upper arm level, right arm, subsequent encounter: Secondary | ICD-10-CM

## 2022-08-18 NOTE — Assessment & Plan Note (Signed)
Acutely occurring with initial injury around 4/9.  MRI was demonstrating a tear of the supraspinatus with retraction. -Counseled on home exercise therapy and supportive care. -Referral to orthopedic surgery.

## 2022-08-18 NOTE — Progress Notes (Signed)
Virtual Visit via Video Note  I connected with Christopher Banks on 08/18/22 at  8:00 AM EDT by a video enabled telemedicine application and verified that I am speaking with the correct person using two identifiers.  Location: Patient: home Provider: office   I discussed the limitations of evaluation and management by telemedicine and the availability of in person appointments. The patient expressed understanding and agreed to proceed.  History of Present Illness:  Christopher Banks is a 67 year old male that is following up after the MRI of his right shoulder.  Continues to have right shoulder pain after the recent injury.  MRI was demonstrating a near complete tear of the supraspinatus with 16 mm of retraction   Observations/Objective:   Assessment and Plan:  Rotator cuff tear of the supraspinatus of the right shoulder: Acutely occurring with initial injury around 4/9.  MRI was demonstrating a tear of the supraspinatus with retraction. -Counseled on home exercise therapy and supportive care. -Referral to orthopedic surgery.  Follow Up Instructions:    I discussed the assessment and treatment plan with the patient. The patient was provided an opportunity to ask questions and all were answered. The patient agreed with the plan and demonstrated an understanding of the instructions.   The patient was advised to call back or seek an in-person evaluation if the symptoms worsen or if the condition fails to improve as anticipated.    Clare Gandy, MD

## 2022-09-11 DIAGNOSIS — M75121 Complete rotator cuff tear or rupture of right shoulder, not specified as traumatic: Secondary | ICD-10-CM | POA: Diagnosis not present

## 2022-09-29 DIAGNOSIS — S46011A Strain of muscle(s) and tendon(s) of the rotator cuff of right shoulder, initial encounter: Secondary | ICD-10-CM | POA: Diagnosis not present

## 2022-09-29 DIAGNOSIS — M24111 Other articular cartilage disorders, right shoulder: Secondary | ICD-10-CM | POA: Diagnosis not present

## 2022-09-29 DIAGNOSIS — M7541 Impingement syndrome of right shoulder: Secondary | ICD-10-CM | POA: Diagnosis not present

## 2022-09-29 DIAGNOSIS — X58XXXA Exposure to other specified factors, initial encounter: Secondary | ICD-10-CM | POA: Diagnosis not present

## 2022-09-29 DIAGNOSIS — G8918 Other acute postprocedural pain: Secondary | ICD-10-CM | POA: Diagnosis not present

## 2022-09-29 DIAGNOSIS — M25811 Other specified joint disorders, right shoulder: Secondary | ICD-10-CM | POA: Diagnosis not present

## 2022-09-29 DIAGNOSIS — Y999 Unspecified external cause status: Secondary | ICD-10-CM | POA: Diagnosis not present

## 2022-09-29 HISTORY — PX: SHOULDER SURGERY: SHX246

## 2022-10-09 DIAGNOSIS — M25611 Stiffness of right shoulder, not elsewhere classified: Secondary | ICD-10-CM | POA: Diagnosis not present

## 2022-10-09 DIAGNOSIS — R531 Weakness: Secondary | ICD-10-CM | POA: Diagnosis not present

## 2022-10-09 DIAGNOSIS — M25511 Pain in right shoulder: Secondary | ICD-10-CM | POA: Diagnosis not present

## 2022-10-12 DIAGNOSIS — M25511 Pain in right shoulder: Secondary | ICD-10-CM | POA: Diagnosis not present

## 2022-10-12 DIAGNOSIS — R531 Weakness: Secondary | ICD-10-CM | POA: Diagnosis not present

## 2022-10-12 DIAGNOSIS — M25611 Stiffness of right shoulder, not elsewhere classified: Secondary | ICD-10-CM | POA: Diagnosis not present

## 2022-10-19 DIAGNOSIS — M25511 Pain in right shoulder: Secondary | ICD-10-CM | POA: Diagnosis not present

## 2022-10-19 DIAGNOSIS — R531 Weakness: Secondary | ICD-10-CM | POA: Diagnosis not present

## 2022-10-19 DIAGNOSIS — M25611 Stiffness of right shoulder, not elsewhere classified: Secondary | ICD-10-CM | POA: Diagnosis not present

## 2022-10-22 DIAGNOSIS — M25611 Stiffness of right shoulder, not elsewhere classified: Secondary | ICD-10-CM | POA: Diagnosis not present

## 2022-10-22 DIAGNOSIS — R531 Weakness: Secondary | ICD-10-CM | POA: Diagnosis not present

## 2022-10-22 DIAGNOSIS — M25511 Pain in right shoulder: Secondary | ICD-10-CM | POA: Diagnosis not present

## 2022-10-27 DIAGNOSIS — M25511 Pain in right shoulder: Secondary | ICD-10-CM | POA: Diagnosis not present

## 2022-10-27 DIAGNOSIS — R531 Weakness: Secondary | ICD-10-CM | POA: Diagnosis not present

## 2022-10-27 DIAGNOSIS — M25611 Stiffness of right shoulder, not elsewhere classified: Secondary | ICD-10-CM | POA: Diagnosis not present

## 2022-10-30 DIAGNOSIS — M25511 Pain in right shoulder: Secondary | ICD-10-CM | POA: Diagnosis not present

## 2022-10-30 DIAGNOSIS — R531 Weakness: Secondary | ICD-10-CM | POA: Diagnosis not present

## 2022-10-30 DIAGNOSIS — M25611 Stiffness of right shoulder, not elsewhere classified: Secondary | ICD-10-CM | POA: Diagnosis not present

## 2022-11-02 DIAGNOSIS — M25611 Stiffness of right shoulder, not elsewhere classified: Secondary | ICD-10-CM | POA: Diagnosis not present

## 2022-11-02 DIAGNOSIS — M25511 Pain in right shoulder: Secondary | ICD-10-CM | POA: Diagnosis not present

## 2022-11-02 DIAGNOSIS — R531 Weakness: Secondary | ICD-10-CM | POA: Diagnosis not present

## 2022-11-05 DIAGNOSIS — M25611 Stiffness of right shoulder, not elsewhere classified: Secondary | ICD-10-CM | POA: Diagnosis not present

## 2022-11-05 DIAGNOSIS — R531 Weakness: Secondary | ICD-10-CM | POA: Diagnosis not present

## 2022-11-05 DIAGNOSIS — M25511 Pain in right shoulder: Secondary | ICD-10-CM | POA: Diagnosis not present

## 2022-11-10 DIAGNOSIS — M25611 Stiffness of right shoulder, not elsewhere classified: Secondary | ICD-10-CM | POA: Diagnosis not present

## 2022-11-10 DIAGNOSIS — R531 Weakness: Secondary | ICD-10-CM | POA: Diagnosis not present

## 2022-11-10 DIAGNOSIS — M25511 Pain in right shoulder: Secondary | ICD-10-CM | POA: Diagnosis not present

## 2022-11-12 DIAGNOSIS — M25511 Pain in right shoulder: Secondary | ICD-10-CM | POA: Diagnosis not present

## 2022-11-12 DIAGNOSIS — M25611 Stiffness of right shoulder, not elsewhere classified: Secondary | ICD-10-CM | POA: Diagnosis not present

## 2022-11-12 DIAGNOSIS — R531 Weakness: Secondary | ICD-10-CM | POA: Diagnosis not present

## 2022-11-17 DIAGNOSIS — M25611 Stiffness of right shoulder, not elsewhere classified: Secondary | ICD-10-CM | POA: Diagnosis not present

## 2022-11-17 DIAGNOSIS — M25511 Pain in right shoulder: Secondary | ICD-10-CM | POA: Diagnosis not present

## 2022-11-17 DIAGNOSIS — R531 Weakness: Secondary | ICD-10-CM | POA: Diagnosis not present

## 2022-11-18 ENCOUNTER — Ambulatory Visit (INDEPENDENT_AMBULATORY_CARE_PROVIDER_SITE_OTHER): Payer: Medicare HMO

## 2022-11-18 VITALS — Wt 179.0 lb

## 2022-11-18 DIAGNOSIS — Z0000 Encounter for general adult medical examination without abnormal findings: Secondary | ICD-10-CM | POA: Diagnosis not present

## 2022-11-18 NOTE — Progress Notes (Signed)
Subjective:   Christopher Banks is a 67 y.o. male who presents for an Initial Medicare Annual Wellness Visit.  Visit Complete: Virtual  I connected with  Christopher Banks on 11/18/22 by a audio enabled telemedicine application and verified that I am speaking with the correct person using two identifiers.  Patient Location: Home  Provider Location: Home Office  I discussed the limitations of evaluation and management by telemedicine. The patient expressed understanding and agreed to proceed.  Vital Signs: Unable to obtain new vitals due to this being a telehealth visit.  Review of Systems     Cardiac Risk Factors include: advanced age (>34men, >85 women);male gender     Objective:    Today's Vitals   11/18/22 1111  Weight: 179 lb (81.2 kg)   Body mass index is 26.43 kg/m.     11/18/2022   11:17 AM  Advanced Directives  Does Patient Have a Medical Advance Directive? Yes  Type of Estate agent of [place];Living will  Copy of Healthcare Power of Attorney in Chart? No - copy requested    Current Medications (verified) Outpatient Encounter Medications as of 11/18/2022  Medication Sig   Cholecalciferol (VITAMIN D3) 50 MCG (2000 UT) capsule Take 2,000 Units by mouth daily.   ferrous sulfate 325 (65 FE) MG EC tablet Take 325 mg by mouth daily.   fluticasone (FLONASE) 50 MCG/ACT nasal spray INSTILL 2 SPRAYS INTO EACH NOSTRIL EVERY DAY   Multiple Vitamin (MULTIVITAMIN) tablet Take 1 tablet by mouth daily.   silodosin (RAPAFLO) 8 MG CAPS capsule TAKE ONE CAPSULE BY MOUTH DAILY AT BEDTIME   [DISCONTINUED] ezetimibe (ZETIA) 10 MG tablet Take 1 tablet (10 mg total) by mouth daily. (Patient not taking: Reported on 06/09/2022)   [DISCONTINUED] meloxicam (MOBIC) 15 MG tablet Take 1 tablet (15 mg total) by mouth daily as needed.   Facility-Administered Encounter Medications as of 11/18/2022  Medication   regadenoson (LEXISCAN) injection SOLN 0.4 mg    Allergies  (verified) Atorvastatin and Tamsulosin   History: Past Medical History:  Diagnosis Date   Allergic rhinitis    dust mites   BPH with obstruction/lower urinary tract symptoms    BPPV (benign paroxysmal positional vertigo)    COVID-19 virus infection 03/2019   prolonged post-covid DOE and HR variability.     Elevated PSA    Prostate bx 09/2018 NORMAL. PSA dec to 7.16 Apr 2019, then 5.14 October 2019.   Elevated TSH    2018, not taking biotin. 2020, same-->T4/T3 normal.   History of adenomatous polyp of colon    Hyperlipidemia    statin started 2022/2023   OSA (obstructive sleep apnea) 08/2019   sleep study->severe OSA->CPAP recommended   Prediabetes    10/2021 fasting glucose 116, hemoglobin A1c 5.7%   Pulmonary nodule Jan/feb 2021   5 mm R lung; pt low risk for lung ca so NO F/U IMAGING IS INDICATED.   Past Surgical History:  Procedure Laterality Date   CARDIOVASCULAR STRESS TEST  12/02/2021   Myocardial perfusion imaging normal, EF normal.   COLONOSCOPY  2012   2012 Normal.  10/2021 adenoma x 1->recall 5 yrs   polysomnogram  08/25/2019   severe OSA->CPAP recommended   PROSTATE BIOPSY  09/2018   NORMAL   SHOULDER SURGERY Right 09/29/2022   per pt   TRANSTHORACIC ECHOCARDIOGRAM     12/23/21 EF 60 to 65%.  Grade 1 diastolic dysfunction.   ZIO patch  01/26/2020   NORMAL   Family History  Problem Relation Age of Onset   Hearing loss Mother    Hyperlipidemia Mother    Irritable bowel syndrome Sister    Other Brother        elevated PSA   Diabetes Maternal Grandfather    Social History   Socioeconomic History   Marital status: Married    Spouse name: Not on file   Number of children: 2   Years of education: Not on file   Highest education level: Bachelor's degree (e.g., BA, AB, BS)  Occupational History   Occupation: Art gallery manager  Tobacco Use   Smoking status: Never   Smokeless tobacco: Never  Vaping Use   Vaping status: Never Used  Substance and Sexual Activity    Alcohol use: Yes    Comment: occasionally   Drug use: Never   Sexual activity: Not on file  Other Topics Concern   Not on file  Social History Narrative   Married, 2 children.   Orig from [place], relocated to [facility] 2018.   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   No tob.   Alc: 2 glasses of wine per night.   Social Determinants of Health   Financial Resource Strain: Low Risk  (11/18/2022)   Overall Financial Resource Strain (CARDIA)    Difficulty of Paying Living Expenses: Not hard at all  Food Insecurity: No Food Insecurity (11/18/2022)   Hunger Vital Sign    Worried About Running Out of Food in the Last Year: Never true    Ran Out of Food in the Last Year: Never true  Transportation Needs: No Transportation Needs (11/18/2022)   PRAPARE - Administrator, Civil Service (Medical): No    Lack of Transportation (Non-Medical): No  Physical Activity: Insufficiently Active (11/18/2022)   Exercise Vital Sign    Days of Exercise per Week: 2 days    Minutes of Exercise per Session: 60 min  Stress: No Stress Concern Present (11/18/2022)   [employer] of Occupational Health - Occupational Stress Questionnaire    Feeling of Stress : Not at all  Social Connections: Moderately Isolated (11/18/2022)   Social Connection and Isolation Panel [NHANES]    Frequency of Communication with Friends and Family: Three times a week    Frequency of Social Gatherings with Friends and Family: More than three times a week    Attends Religious Services: Never    Database administrator or Organizations: No    Attends Engineer, structural: Never    Marital Status: Married    Tobacco Counseling Counseling given: Not Answered   Clinical Intake:  Pre-visit preparation completed: Yes  Pain : No/denies pain     BMI - recorded: 26.43 Nutritional Status: BMI 25 -29 Overweight Nutritional Risks: None Diabetes: No  How often do you need to have someone help you  when you read instructions, pamphlets, or other written materials from your doctor or pharmacy?: 1 - Never  Interpreter Needed?: No  Information entered by :: Lanier Ensign, LPN   Activities of Daily Living    11/18/2022   11:12 AM  In your present state of health, do you have any difficulty performing the following activities:  Hearing? 0  Vision? 0  Difficulty concentrating or making decisions? 0  Walking or climbing stairs? 0  Dressing or bathing? 0  Doing errands, shopping? 0  Preparing Food and eating ? N  Using the Toilet? N  In the past six months, have you accidently leaked  urine? N  Do you have problems with loss of bowel control? N  Managing your Medications? N  Managing your Finances? N  Housekeeping or managing your Housekeeping? N    Patient Care Team: Jeoffrey Massed, MD as PCP - General (Family Medicine) McKenzie, Mardene Celeste, MD as Consulting Physician (Urology) Huston Foley, MD as Consulting Physician (Neurology) O'Neal, Ronnald Ramp, MD as Consulting Physician (Cardiology) Willis Modena, MD as Consulting Physician (Gastroenterology)  Indicate any recent Medical Services you may have received from other than [facility] providers in the past year (date may be approximate).     Assessment:   This is a routine wellness examination for [place].  Hearing/Vision screen Hearing Screening - Comments:: Pt denies any hearing issues  Vision Screening - Comments:: Pt encouraged to follow up with provider   Dietary issues and exercise activities discussed:     Goals Addressed             This Visit's Progress    Patient Stated       None at this time        Depression Screen    11/18/2022   11:17 AM 06/09/2022   10:19 AM 02/27/2021    8:49 AM 10/31/2020    8:02 AM 07/13/2019    8:17 AM 06/09/2018    9:34 AM  PHQ 2/9 Scores  PHQ - 2 Score 0 0 0 0 0 0    Fall Risk    11/18/2022   11:19 AM 06/09/2022    9:06 AM 02/27/2021    8:50 AM 10/31/2020    8:02  AM  Fall Risk   Falls in the past year? 0 0 0 0  Number falls in past yr: 0  0 0  Injury with Fall? 0  0 0  Risk for fall due to : No Fall Risks  No Fall Risks   Follow up Falls prevention discussed  Falls evaluation completed Falls evaluation completed    MEDICARE RISK AT HOME:  Medicare Risk at Home - 11/18/22 1119     Any stairs in or around the home? Yes    If so, are there any without handrails? No    Home free of loose throw rugs in walkways, pet beds, electrical cords, etc? Yes    Adequate lighting in your home to reduce risk of falls? Yes    Life alert? No    Use of a cane, walker or w/c? No    Grab bars in the bathroom? No    Shower chair or bench in shower? No    Elevated toilet seat or a handicapped toilet? No             TIMED UP AND GO:  Was the test performed? No    Cognitive Function:        11/18/2022   11:21 AM  6CIT Screen  What Year? 0 points  What month? 0 points  What time? 0 points  Count back from 20 0 points  Months in reverse 0 points  Repeat phrase 0 points  Total Score 0 points    Immunizations Immunization History  Administered Date(s) Administered   Influenza,inj,Quad PF,6+ Mos 06/09/2018, 01/15/2020   Influenza-Unspecified 03/09/2022   Moderna Sars-Covid-2 Vaccination 07/27/2019, 08/16/2019   PNEUMOCOCCAL CONJUGATE-20 10/31/2020   Td 04/13/2014   Zoster Recombinant(Shingrix) 10/31/2020, 01/02/2021    TDAP status: Up to date  Flu Vaccine status: Due, Education has been provided regarding the importance of this vaccine. Advised may receive  this vaccine at local pharmacy or Health Dept. Aware to provide a copy of the vaccination record if obtained from local pharmacy or Health Dept. Verbalized acceptance and understanding.  Pneumococcal vaccine status: Up to date  Covid-19 vaccine status: Completed vaccines  Qualifies for Shingles Vaccine? Yes   Zostavax completed Yes   Shingrix Completed?: Yes  Screening Tests Health  Maintenance  Topic Date Due   COVID-19 Vaccine (3 - Moderna risk series) 09/13/2019   INFLUENZA VACCINE  11/12/2022   Medicare Annual Wellness (AWV)  11/18/2023   DTaP/Tdap/Td (2 - Tdap) 04/13/2024   Colonoscopy  11/11/2026   Pneumonia Vaccine 85+ Years old  Completed   Zoster Vaccines- Shingrix  Completed   HPV VACCINES  Aged Out   Hepatitis C Screening  Discontinued    Health Maintenance  Health Maintenance Due  Topic Date Due   COVID-19 Vaccine (3 - Moderna risk series) 09/13/2019   INFLUENZA VACCINE  11/12/2022    Colorectal cancer screening: Type of screening: Colonoscopy. Completed 11/10/21. Repeat every 5 years  Additional Screening:  Hepatitis C Screening:  Completed 07/13/19  Vision Screening: Recommended annual ophthalmology exams for early detection of glaucoma and other disorders of the eye. Is the patient up to date with their annual eye exam?  No  Who is the provider or what is the name of the office in which the patient attends annual eye exams? Encouraged to follow up If pt is not established with a provider, would they like to be referred to a provider to establish care? No .   Dental Screening: Recommended annual dental exams for proper oral hygiene    Community Resource Referral / Chronic Care Management: CRR required this visit?  No   CCM required this visit?  No    Plan:     I have personally reviewed and noted the following in the patient's chart:   Medical and social history Use of alcohol, tobacco or illicit drugs  Current medications and supplements including opioid prescriptions. Patient is not currently taking opioid prescriptions. Functional ability and status Nutritional status Physical activity Advanced directives List of other physicians Hospitalizations, surgeries, and ER visits in previous 12 months Vitals Screenings to include cognitive, depression, and falls Referrals and appointments  In addition, I have reviewed and  discussed with patient certain preventive protocols, quality metrics, and best practice recommendations. A written personalized care plan for preventive services as well as general preventive health recommendations were provided to patient.     Marzella Schlein, LPN   08/13/8411   After Visit Summary: (MyChart) Due to this being a telephonic visit, the after visit summary with patients personalized plan was offered to patient via MyChart   Nurse Notes: none

## 2022-11-18 NOTE — Patient Instructions (Signed)
Christopher Banks , Thank you for taking time to come for your Medicare Wellness Visit. I appreciate your ongoing commitment to your health goals. Please review the following plan we discussed and let me know if I can assist you in the future.   Referrals/Orders/Follow-Ups/Clinician Recommendations: encouraged to follow up with eye provider   This is a list of the screening recommended for you and due dates:  Health Maintenance  Topic Date Due   COVID-19 Vaccine (3 - Moderna risk series) 09/13/2019   Flu Shot  11/12/2022   Medicare Annual Wellness Visit  11/18/2023   DTaP/Tdap/Td vaccine (2 - Tdap) 04/13/2024   Colon Cancer Screening  11/11/2026   Pneumonia Vaccine  Completed   Zoster (Shingles) Vaccine  Completed   HPV Vaccine  Aged Out   Hepatitis C Screening  Discontinued    Advanced directives: (Copy Requested) Please bring a copy of your health care power of attorney and living will to the office to be added to your chart at your convenience.  Next Medicare Annual Wellness Visit scheduled for next year: Yes  Preventive Care 71 Years and Older, Male  Preventive care refers to lifestyle choices and visits with your health care provider that can promote health and wellness. What does preventive care include? A yearly physical exam. This is also called an annual well check. Dental exams once or twice a year. Routine eye exams. Ask your health care provider how often you should have your eyes checked. Personal lifestyle choices, including: Daily care of your teeth and gums. Regular physical activity. Eating a healthy diet. Avoiding tobacco and drug use. Limiting alcohol use. Practicing safe sex. Taking low doses of aspirin every day. Taking vitamin and mineral supplements as recommended by your health care provider. What happens during an annual well check? The services and screenings done by your health care provider during your annual well check will depend on your age, overall  health, lifestyle risk factors, and family history of disease. Counseling  Your health care provider may ask you questions about your: Alcohol use. Tobacco use. Drug use. Emotional well-being. Home and relationship well-being. Sexual activity. Eating habits. History of falls. Memory and ability to understand (cognition). Work and work Astronomer. Screening  You may have the following tests or measurements: Height, weight, and BMI. Blood pressure. Lipid and cholesterol levels. These may be checked every 5 years, or more frequently if you are over 93 years old. Skin check. Lung cancer screening. You may have this screening every year starting at age 70 if you have a 30-pack-year history of smoking and currently smoke or have quit within the past 15 years. Fecal occult blood test (FOBT) of the stool. You may have this test every year starting at age 2. Flexible sigmoidoscopy or colonoscopy. You may have a sigmoidoscopy every 5 years or a colonoscopy every 10 years starting at age 1. Prostate cancer screening. Recommendations will vary depending on your family history and other risks. Hepatitis C blood test. Hepatitis B blood test. Sexually transmitted disease (STD) testing. Diabetes screening. This is done by checking your blood sugar (glucose) after you have not eaten for a while (fasting). You may have this done every 1-3 years. Abdominal aortic aneurysm (AAA) screening. You may need this if you are a current or former smoker. Osteoporosis. You may be screened starting at age 62 if you are at high risk. Talk with your health care provider about your test results, treatment options, and if necessary, the need for more  tests. Vaccines  Your health care provider may recommend certain vaccines, such as: Influenza vaccine. This is recommended every year. Tetanus, diphtheria, and acellular pertussis (Tdap, Td) vaccine. You may need a Td booster every 10 years. Zoster vaccine. You may  need this after age 11. Pneumococcal 13-valent conjugate (PCV13) vaccine. One dose is recommended after age 58. Pneumococcal polysaccharide (PPSV23) vaccine. One dose is recommended after age 58. Talk to your health care provider about which screenings and vaccines you need and how often you need them. This information is not intended to replace advice given to you by your health care provider. Make sure you discuss any questions you have with your health care provider. Document Released: 04/26/2015 Document Revised: 12/18/2015 Document Reviewed: 01/29/2015 Elsevier Interactive Patient Education  2017 [employer].  Fall Prevention in the Home Falls can cause injuries. They can happen to people of all ages. There are many things you can do to make your home safe and to help prevent falls. What can I do on the outside of my home? Regularly fix the edges of walkways and driveways and fix any cracks. Remove anything that might make you trip as you walk through a door, such as a raised step or threshold. Trim any bushes or trees on the path to your home. Use bright outdoor lighting. Clear any walking paths of anything that might make someone trip, such as rocks or tools. Regularly check to see if handrails are loose or broken. Make sure that both sides of any steps have handrails. Any raised decks and porches should have guardrails on the edges. Have any leaves, snow, or ice cleared regularly. Use sand or salt on walking paths during winter. Clean up any spills in your garage right away. This includes oil or grease spills. What can I do in the bathroom? Use night lights. Install grab bars by the toilet and in the tub and shower. Do not use towel bars as grab bars. Use non-skid mats or decals in the tub or shower. If you need to sit down in the shower, use a plastic, non-slip stool. Keep the floor dry. Clean up any water that spills on the floor as soon as it happens. Remove soap buildup in  the tub or shower regularly. Attach bath mats securely with double-sided non-slip rug tape. Do not have throw rugs and other things on the floor that can make you trip. What can I do in the bedroom? Use night lights. Make sure that you have a light by your bed that is easy to reach. Do not use any sheets or blankets that are too big for your bed. They should not hang down onto the floor. Have a firm chair that has side arms. You can use this for support while you get dressed. Do not have throw rugs and other things on the floor that can make you trip. What can I do in the kitchen? Clean up any spills right away. Avoid walking on wet floors. Keep items that you use a lot in easy-to-reach places. If you need to reach something above you, use a strong step stool that has a grab bar. Keep electrical cords out of the way. Do not use floor polish or wax that makes floors slippery. If you must use wax, use non-skid floor wax. Do not have throw rugs and other things on the floor that can make you trip. What can I do with my stairs? Do not leave any items on the stairs. Make sure  that there are handrails on both sides of the stairs and use them. Fix handrails that are broken or loose. Make sure that handrails are as long as the stairways. Check any carpeting to make sure that it is firmly attached to the stairs. Fix any carpet that is loose or worn. Avoid having throw rugs at the top or bottom of the stairs. If you do have throw rugs, attach them to the floor with carpet tape. Make sure that you have a light switch at the top of the stairs and the bottom of the stairs. If you do not have them, ask someone to add them for you. What else can I do to help prevent falls? Wear shoes that: Do not have high heels. Have rubber bottoms. Are comfortable and fit you well. Are closed at the toe. Do not wear sandals. If you use a stepladder: Make sure that it is fully opened. Do not climb a closed  stepladder. Make sure that both sides of the stepladder are locked into place. Ask someone to hold it for you, if possible. Clearly mark and make sure that you can see: Any grab bars or handrails. First and last steps. Where the edge of each step is. Use tools that help you move around (mobility aids) if they are needed. These include: Canes. Walkers. Scooters. Crutches. Turn on the lights when you go into a dark area. Replace any light bulbs as soon as they burn out. Set up your furniture so you have a clear path. Avoid moving your furniture around. If any of your floors are uneven, fix them. If there are any pets around you, be aware of where they are. Review your medicines with your doctor. Some medicines can make you feel dizzy. This can increase your chance of falling. Ask your doctor what other things that you can do to help prevent falls. This information is not intended to replace advice given to you by your health care provider. Make sure you discuss any questions you have with your health care provider. Document Released: 01/24/2009 Document Revised: 09/05/2015 Document Reviewed: 05/04/2014 Elsevier Interactive Patient Education  2017 [employer].

## 2022-11-19 DIAGNOSIS — M25611 Stiffness of right shoulder, not elsewhere classified: Secondary | ICD-10-CM | POA: Diagnosis not present

## 2022-11-19 DIAGNOSIS — M25511 Pain in right shoulder: Secondary | ICD-10-CM | POA: Diagnosis not present

## 2022-11-19 DIAGNOSIS — R531 Weakness: Secondary | ICD-10-CM | POA: Diagnosis not present

## 2022-11-24 DIAGNOSIS — M25611 Stiffness of right shoulder, not elsewhere classified: Secondary | ICD-10-CM | POA: Diagnosis not present

## 2022-11-24 DIAGNOSIS — R531 Weakness: Secondary | ICD-10-CM | POA: Diagnosis not present

## 2022-11-24 DIAGNOSIS — M25511 Pain in right shoulder: Secondary | ICD-10-CM | POA: Diagnosis not present

## 2022-11-26 DIAGNOSIS — R531 Weakness: Secondary | ICD-10-CM | POA: Diagnosis not present

## 2022-11-26 DIAGNOSIS — M25611 Stiffness of right shoulder, not elsewhere classified: Secondary | ICD-10-CM | POA: Diagnosis not present

## 2022-11-26 DIAGNOSIS — M25511 Pain in right shoulder: Secondary | ICD-10-CM | POA: Diagnosis not present

## 2022-12-04 DIAGNOSIS — M25611 Stiffness of right shoulder, not elsewhere classified: Secondary | ICD-10-CM | POA: Diagnosis not present

## 2022-12-04 DIAGNOSIS — M25511 Pain in right shoulder: Secondary | ICD-10-CM | POA: Diagnosis not present

## 2022-12-04 DIAGNOSIS — R531 Weakness: Secondary | ICD-10-CM | POA: Diagnosis not present

## 2022-12-08 DIAGNOSIS — M25511 Pain in right shoulder: Secondary | ICD-10-CM | POA: Diagnosis not present

## 2022-12-08 DIAGNOSIS — M25611 Stiffness of right shoulder, not elsewhere classified: Secondary | ICD-10-CM | POA: Diagnosis not present

## 2022-12-08 DIAGNOSIS — R531 Weakness: Secondary | ICD-10-CM | POA: Diagnosis not present

## 2022-12-10 DIAGNOSIS — R531 Weakness: Secondary | ICD-10-CM | POA: Diagnosis not present

## 2022-12-10 DIAGNOSIS — M25611 Stiffness of right shoulder, not elsewhere classified: Secondary | ICD-10-CM | POA: Diagnosis not present

## 2022-12-10 DIAGNOSIS — M25511 Pain in right shoulder: Secondary | ICD-10-CM | POA: Diagnosis not present

## 2022-12-17 DIAGNOSIS — M25511 Pain in right shoulder: Secondary | ICD-10-CM | POA: Diagnosis not present

## 2022-12-17 DIAGNOSIS — M25611 Stiffness of right shoulder, not elsewhere classified: Secondary | ICD-10-CM | POA: Diagnosis not present

## 2022-12-17 DIAGNOSIS — R531 Weakness: Secondary | ICD-10-CM | POA: Diagnosis not present

## 2022-12-22 DIAGNOSIS — M25611 Stiffness of right shoulder, not elsewhere classified: Secondary | ICD-10-CM | POA: Diagnosis not present

## 2022-12-22 DIAGNOSIS — M25511 Pain in right shoulder: Secondary | ICD-10-CM | POA: Diagnosis not present

## 2022-12-22 DIAGNOSIS — R531 Weakness: Secondary | ICD-10-CM | POA: Diagnosis not present

## 2022-12-29 DIAGNOSIS — M25511 Pain in right shoulder: Secondary | ICD-10-CM | POA: Diagnosis not present

## 2022-12-29 DIAGNOSIS — R531 Weakness: Secondary | ICD-10-CM | POA: Diagnosis not present

## 2022-12-29 DIAGNOSIS — M25611 Stiffness of right shoulder, not elsewhere classified: Secondary | ICD-10-CM | POA: Diagnosis not present

## 2023-01-11 DIAGNOSIS — G4733 Obstructive sleep apnea (adult) (pediatric): Secondary | ICD-10-CM | POA: Insufficient documentation

## 2023-01-11 NOTE — Progress Notes (Unsigned)
PATIENT: Christopher Banks DOB: 1955-10-15  REASON FOR VISIT: follow up HISTORY FROM: patient  No chief complaint on file.    HISTORY OF PRESENT ILLNESS:  01/11/23 ALL: Christopher Banks returns for follow up for OSA on CPAP. He continues to do well on therapy.   05/27/20 ALL:  Christopher Banks is a 67 y.o. male here today for follow up for OSA on CPAP.  He reports that he is doing well with CPAP therapy.  He has no longer snoring.  He seems to be resting better.  He does continue to note a leak in his mask.  He is working with the DME company to find a mask that works best for him.  Compliance report dated 04/27/2020 through 05/26/2020 reveals that he used CPAP 30 of the past 30 days for compliance of 100%.  He is CPAP greater than 4 hours 29 of the past 30 days for compliance of 97%.  Average usage on days used was 7 hours.  Residual AHI was 1.3 on 7 to 14 cm of water and an EPR of 1.  There was a leak noted in the 95th percentile of 27.2 L/min.  11/21/2019 ALL: Christopher Banks is a 67 y.o. male here today for follow up for recently diagnosed OSA started on CPAP therapy. HST on 5/12 showed "severe obstructive sleep apnea with a total AHI of 39.1/hour and O2 nadir of 78%". He reports that he is adjusting to therapy. He is using a full face mask and feels that he is doing fairly well. He does note a leak from time to time but has been monitoring this at home. He does not sleep well. He is restless. He exercises every morning. He is getting about 7 hours of sleep nightly.   Compliance report dated 10/21/2019 through 11/19/2019 reveals that he has used CPAP therapy 30 of the past 30 days for compliance of 100%. He is CPAP greater than 4 hours 29 in the past 30 days for compliance of 97%. Average usage was 7 hours and 25 minutes. Residual AHI was 3.9 on 7 to 14 cm of water and EPR 1. There was a leak noted in the 95th percentile of 23.5 L/min.  HISTORY: (copied from Dr Teofilo Pod note on 07/27/2019)  Dear Dr.  Milinda Cave,  I saw your patient, Christopher Banks, upon your kind request of a sleep clinic today for initial consultation of his sleep disorder combined particular, concern for underlying obstructive sleep apnea.  The patient is unaccompanied today.  As you know, Mr. Christopher Banks is a 67 year old right-handed gentleman with an underlying medical history of hyperlipidemia, elevated TSH, elevated PSA, vertigo, allergic rhinitis, history of Covid diagnosis in December 2020, pulmonary nodule, and mildly overweight state, who reports snoring and excessive daytime somnolence at times as well as witnessed apneas per wife's report.  I reviewed your office note from 07/13/2019.  His Epworth sleepiness score is 10 out of 24, fatigue severity score is 28 out of 63.  He lives with his wife, they have 2 grandchildren and first grandchild on the way.  He is a non-smoker and drinks alcohol in the form of wine, 1 to 2 glasses/day.  He limits his caffeine to 1 cup of coffee per day on average.  He exercises in the mornings.  He goes to bed around 10 and rise time is typically between 5 and 5:45 AM.  He is often awake early.  He has nocturia about 2-3 times per average night and denies recurrent morning  headaches.  He has had intermittent vertigo symptoms.  He works as an Art gallery manager, currently back in the office.  He has a TV in the bedroom but does not watch it.  They have 2 small dogs in the household, both sleep in the bedroom, one of them typically on the bed with them.  He has no obvious family history of sleep apnea.  He has woken up with a leg cramp but denies any telltale symptoms of restless leg syndrome.   REVIEW OF SYSTEMS: Out of a complete 14 system review of symptoms, the patient complains only of the following symptoms, restless sleep and all other reviewed systems are negative.  ESS:8 FSS:22  ALLERGIES: Allergies  Allergen Reactions   Atorvastatin     Indigestion, trouble sleeping   Tamsulosin Other (See Comments)     lightheadedness    HOME MEDICATIONS: Outpatient Medications Prior to Visit  Medication Sig Dispense Refill   Cholecalciferol (VITAMIN D3) 50 MCG (2000 UT) capsule Take 2,000 Units by mouth daily.     ferrous sulfate 325 (65 FE) MG EC tablet Take 325 mg by mouth daily.     fluticasone (FLONASE) 50 MCG/ACT nasal spray INSTILL 2 SPRAYS INTO EACH NOSTRIL EVERY DAY 16 g 1   Multiple Vitamin (MULTIVITAMIN) tablet Take 1 tablet by mouth daily.     silodosin (RAPAFLO) 8 MG CAPS capsule TAKE ONE CAPSULE BY MOUTH DAILY AT BEDTIME 90 capsule 0   Facility-Administered Medications Prior to Visit  Medication Dose Route Frequency Provider Last Rate Last Admin   regadenoson (LEXISCAN) injection SOLN 0.4 mg  0.4 mg Intravenous Once Lewayne Bunting, MD        PAST MEDICAL HISTORY: Past Medical History:  Diagnosis Date   Allergic rhinitis    dust mites   BPH with obstruction/lower urinary tract symptoms    BPPV (benign paroxysmal positional vertigo)    COVID-19 virus infection 03/2019   prolonged post-covid DOE and HR variability.     Elevated PSA    Prostate bx 09/2018 NORMAL. PSA dec to 7.16 Apr 2019, then 5.14 October 2019.   Elevated TSH    2018, not taking biotin. 2020, same-->T4/T3 normal.   History of adenomatous polyp of colon    Hyperlipidemia    statin started 2022/2023   OSA (obstructive sleep apnea) 08/2019   sleep study->severe OSA->CPAP recommended   Prediabetes    10/2021 fasting glucose 116, hemoglobin A1c 5.7%   Pulmonary nodule Jan/feb 2021   5 mm R lung; pt low risk for lung ca so NO F/U IMAGING IS INDICATED.    PAST SURGICAL HISTORY: Past Surgical History:  Procedure Laterality Date   CARDIOVASCULAR STRESS TEST  12/02/2021   Myocardial perfusion imaging normal, EF normal.   COLONOSCOPY  2012   2012 Normal.  10/2021 adenoma x 1->recall 5 yrs   polysomnogram  08/25/2019   severe OSA->CPAP recommended   PROSTATE BIOPSY  09/2018   NORMAL   SHOULDER SURGERY Right  09/29/2022   per pt   TRANSTHORACIC ECHOCARDIOGRAM     12/23/21 EF 60 to 65%.  Grade 1 diastolic dysfunction.   ZIO patch  01/26/2020   NORMAL    FAMILY HISTORY: Family History  Problem Relation Age of Onset   Hearing loss Mother    Hyperlipidemia Mother    Irritable bowel syndrome Sister    Other Brother        elevated PSA   Diabetes Maternal Grandfather     SOCIAL HISTORY: Social History  Socioeconomic History   Marital status: Married    Spouse name: Not on file   Number of children: 2   Years of education: Not on file   Highest education level: Bachelor's degree (e.g., BA, AB, BS)  Occupational History   Occupation: Art gallery manager  Tobacco Use   Smoking status: Never   Smokeless tobacco: Never  Vaping Use   Vaping status: Never Used  Substance and Sexual Activity   Alcohol use: Yes    Comment: occasionally   Drug use: Never   Sexual activity: Not on file  Other Topics Concern   Not on file  Social History Narrative   Married, 2 children.   Orig from [place], relocated to [facility] 2018.   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   No tob.   Alc: 2 glasses of wine per night.   Social Determinants of Health   Financial Resource Strain: Low Risk  (11/18/2022)   Overall Financial Resource Strain (CARDIA)    Difficulty of Paying Living Expenses: Not hard at all  Food Insecurity: No Food Insecurity (11/18/2022)   Hunger Vital Sign    Worried About Running Out of Food in the Last Year: Never true    Ran Out of Food in the Last Year: Never true  Transportation Needs: No Transportation Needs (11/18/2022)   PRAPARE - Administrator, Civil Service (Medical): No    Lack of Transportation (Non-Medical): No  Physical Activity: Insufficiently Active (11/18/2022)   Exercise Vital Sign    Days of Exercise per Week: 2 days    Minutes of Exercise per Session: 60 min  Stress: No Stress Concern Present (11/18/2022)   [employer] of Occupational  Health - Occupational Stress Questionnaire    Feeling of Stress : Not at all  Social Connections: Moderately Isolated (11/18/2022)   Social Connection and Isolation Panel [NHANES]    Frequency of Communication with Friends and Family: Three times a week    Frequency of Social Gatherings with Friends and Family: More than three times a week    Attends Religious Services: Never    Database administrator or Organizations: No    Attends Banker Meetings: Never    Marital Status: Married  Catering manager Violence: Not At Risk (11/18/2022)   Humiliation, Afraid, Rape, and Kick questionnaire    Fear of Current or Ex-Partner: No    Emotionally Abused: No    Physically Abused: No    Sexually Abused: No      PHYSICAL EXAM  There were no vitals filed for this visit.  There is no height or weight on file to calculate BMI.  Generalized: Well developed, in no acute distress  Cardiology: normal rate and rhythm, no murmur noted Respiratory: clear to auscultation bilaterally  Neurological examination  Mentation: Alert oriented to time, place, history taking. Follows all commands speech and language fluent Cranial nerve II-XII: Pupils were equal round reactive to light. Extraocular movements were full, visual field were full  Motor: The motor testing reveals 5 over 5 strength of all 4 extremities. Good symmetric motor tone is noted throughout.  Gait and station: Gait is normal.    DIAGNOSTIC DATA (LABS, IMAGING, TESTING) - I reviewed patient records, labs, notes, testing and imaging myself where available.      No data to display           Lab Results  Component Value Date   WBC 4.0 11/03/2021   HGB  14.0 11/03/2021   HCT 41.5 11/03/2021   MCV 98.6 11/03/2021   PLT 310.0 11/03/2021      Component Value Date/Time   NA 137 11/03/2021 0832   NA 140 06/12/2016 0000   K 4.4 11/03/2021 0832   CL 104 11/03/2021 0832   CO2 27 11/03/2021 0832   GLUCOSE 116 (H) 11/03/2021  0832   BUN 14 11/03/2021 0832   BUN 16 06/12/2016 0000   CREATININE 1.06 11/03/2021 0832   CREATININE 1.10 04/27/2019 0846   CALCIUM 9.5 11/03/2021 0832   PROT 6.7 11/03/2021 0832   ALBUMIN 4.4 11/03/2021 0832   AST 28 11/03/2021 0832   ALT 22 11/03/2021 0832   ALKPHOS 44 11/03/2021 0832   BILITOT 0.9 11/03/2021 0832   Lab Results  Component Value Date   CHOL 203 (H) 11/03/2021   HDL 80.50 11/03/2021   LDLCALC 112 (H) 11/03/2021   TRIG 48.0 11/03/2021   CHOLHDL 3 11/03/2021   Lab Results  Component Value Date   HGBA1C 5.7 11/04/2021   Lab Results  Component Value Date   VITAMINB12 746 06/24/2016   Lab Results  Component Value Date   TSH 3.08 11/03/2021       ASSESSMENT AND PLAN 67 y.o. year old male  has a past medical history of Allergic rhinitis, BPH with obstruction/lower urinary tract symptoms, BPPV (benign paroxysmal positional vertigo), COVID-19 virus infection (03/2019), Elevated PSA, Elevated TSH, History of adenomatous polyp of colon, Hyperlipidemia, OSA (obstructive sleep apnea) (08/2019), Prediabetes, and Pulmonary nodule (Jan/feb 2021). here with   No diagnosis found.   Jashua is doing well on CPAP therapy. Compliance report reveals excellent compliance. He was encouraged to continue using CPAP nightly and for greater than 4 hours each night. He will continue to monitor for a leak at home. May consider mask refitting if needed. I have suggested he try melatonin 3-5mg  at night to see if this helps. He will follow-up in 6 months, sooner if needed. He verbalizes understanding and agreement with this plan.   No orders of the defined types were placed in this encounter.    No orders of the defined types were placed in this encounter.     Shawnie Dapper, FNP-C 01/11/2023, 3:59 PM [facility] [address] [place], [place] [address] [phone]

## 2023-01-11 NOTE — Patient Instructions (Incomplete)

## 2023-01-13 ENCOUNTER — Ambulatory Visit: Payer: Medicare HMO | Admitting: Family Medicine

## 2023-01-13 ENCOUNTER — Encounter: Payer: Self-pay | Admitting: Family Medicine

## 2023-01-13 VITALS — BP 142/82 | HR 57 | Ht 69.0 in | Wt 184.0 lb

## 2023-01-13 DIAGNOSIS — G4733 Obstructive sleep apnea (adult) (pediatric): Secondary | ICD-10-CM | POA: Diagnosis not present

## 2023-02-01 DIAGNOSIS — Z9889 Other specified postprocedural states: Secondary | ICD-10-CM | POA: Diagnosis not present

## 2023-02-02 DIAGNOSIS — N400 Enlarged prostate without lower urinary tract symptoms: Secondary | ICD-10-CM | POA: Diagnosis not present

## 2023-02-02 DIAGNOSIS — M1990 Unspecified osteoarthritis, unspecified site: Secondary | ICD-10-CM | POA: Diagnosis not present

## 2023-02-05 ENCOUNTER — Ambulatory Visit: Payer: Medicare HMO | Admitting: Family Medicine

## 2023-02-05 VITALS — BP 126/77 | HR 61 | Ht 70.5 in | Wt 184.8 lb

## 2023-02-05 DIAGNOSIS — Z0000 Encounter for general adult medical examination without abnormal findings: Secondary | ICD-10-CM

## 2023-02-05 DIAGNOSIS — R42 Dizziness and giddiness: Secondary | ICD-10-CM | POA: Diagnosis not present

## 2023-02-05 DIAGNOSIS — R7303 Prediabetes: Secondary | ICD-10-CM | POA: Diagnosis not present

## 2023-02-05 DIAGNOSIS — E7800 Pure hypercholesterolemia, unspecified: Secondary | ICD-10-CM | POA: Diagnosis not present

## 2023-02-05 DIAGNOSIS — E038 Other specified hypothyroidism: Secondary | ICD-10-CM | POA: Diagnosis not present

## 2023-02-05 DIAGNOSIS — R972 Elevated prostate specific antigen [PSA]: Secondary | ICD-10-CM

## 2023-02-05 DIAGNOSIS — Z125 Encounter for screening for malignant neoplasm of prostate: Secondary | ICD-10-CM

## 2023-02-05 DIAGNOSIS — Z23 Encounter for immunization: Secondary | ICD-10-CM

## 2023-02-05 LAB — COMPREHENSIVE METABOLIC PANEL
ALT: 19 U/L (ref 0–53)
AST: 28 U/L (ref 0–37)
Albumin: 4.1 g/dL (ref 3.5–5.2)
Alkaline Phosphatase: 54 U/L (ref 39–117)
BUN: 9 mg/dL (ref 6–23)
CO2: 30 meq/L (ref 19–32)
Calcium: 9.3 mg/dL (ref 8.4–10.5)
Chloride: 103 meq/L (ref 96–112)
Creatinine, Ser: 0.99 mg/dL (ref 0.40–1.50)
GFR: 78.63 mL/min (ref >60.00)
Glucose, Bld: 119 mg/dL — ABNORMAL HIGH (ref 70–99)
Potassium: 4.2 meq/L (ref 3.5–5.1)
Sodium: 138 meq/L (ref 135–145)
Total Bilirubin: 0.5 mg/dL (ref 0.2–1.2)
Total Protein: 6.8 g/dL (ref 6.0–8.3)

## 2023-02-05 LAB — CBC
HCT: 41.9 % (ref 39.0–52.0)
Hemoglobin: 13.9 g/dL (ref 13.0–17.0)
MCHC: 33.1 g/dL (ref 30.0–36.0)
MCV: 99.3 fL (ref 78.0–100.0)
Platelets: 270 10*3/uL (ref 150.0–400.0)
RBC: 4.22 Mil/uL (ref 4.22–5.81)
RDW: 13.5 % (ref 11.5–15.5)
WBC: 4 10*3/uL (ref 4.0–10.5)

## 2023-02-05 LAB — LIPID PANEL
Cholesterol: 217 mg/dL — ABNORMAL HIGH (ref 0–200)
HDL: 82.1 mg/dL (ref >39.00)
LDL Cholesterol: 126 mg/dL — ABNORMAL HIGH (ref 0–99)
NonHDL: 135.35
Total CHOL/HDL Ratio: 3
Triglycerides: 48 mg/dL (ref 0.0–149.0)
VLDL: 9.6 mg/dL (ref 0.0–40.0)

## 2023-02-05 LAB — PSA, MEDICARE: PSA: 7.5 ng/mL — ABNORMAL HIGH (ref 0.10–4.00)

## 2023-02-05 LAB — HEMOGLOBIN A1C: Hgb A1c MFr Bld: 5.6 % (ref 4.6–6.5)

## 2023-02-05 LAB — TSH: TSH: 2.97 u[IU]/mL (ref 0.35–5.50)

## 2023-02-05 NOTE — Patient Instructions (Signed)
Health Maintenance, Male Adopting a healthy lifestyle and getting preventive care are important in promoting health and wellness. Ask your health care provider about: The right schedule for you to have regular tests and exams. Things you can do on your own to prevent diseases and keep yourself healthy. What should I know about diet, weight, and exercise? Eat a healthy diet  Eat a diet that includes plenty of vegetables, fruits, low-fat dairy products, and lean protein. Do not eat a lot of foods that are high in solid fats, added sugars, or sodium. Maintain a healthy weight Body mass index (BMI) is a measurement that can be used to identify possible weight problems. It estimates body fat based on height and weight. Your health care provider can help determine your BMI and help you achieve or maintain a healthy weight. Get regular exercise Get regular exercise. This is one of the most important things you can do for your health. Most adults should: Exercise for at least 150 minutes each week. The exercise should increase your heart rate and make you sweat (moderate-intensity exercise). Do strengthening exercises at least twice a week. This is in addition to the moderate-intensity exercise. Spend less time sitting. Even light physical activity can be beneficial. Watch cholesterol and blood lipids Have your blood tested for lipids and cholesterol at 68 years of age, then have this test every 5 years. You may need to have your cholesterol levels checked more often if: Your lipid or cholesterol levels are high. You are older than 68 years of age. You are at high risk for heart disease. What should I know about cancer screening? Many types of cancers can be detected early and may often be prevented. Depending on your health history and family history, you may need to have cancer screening at various ages. This may include screening for: Colorectal cancer. Prostate cancer. Skin cancer. Lung  cancer. What should I know about heart disease, diabetes, and high blood pressure? Blood pressure and heart disease High blood pressure causes heart disease and increases the risk of stroke. This is more likely to develop in people who have high blood pressure readings or are overweight. Talk with your health care provider about your target blood pressure readings. Have your blood pressure checked: Every 3-5 years if you are 18-39 years of age. Every year if you are 40 years old or older. If you are between the ages of 65 and 75 and are a current or former smoker, ask your health care provider if you should have a one-time screening for abdominal aortic aneurysm (AAA). Diabetes Have regular diabetes screenings. This checks your fasting blood sugar level. Have the screening done: Once every three years after age 45 if you are at a normal weight and have a low risk for diabetes. More often and at a younger age if you are overweight or have a high risk for diabetes. What should I know about preventing infection? Hepatitis B If you have a higher risk for hepatitis B, you should be screened for this virus. Talk with your health care provider to find out if you are at risk for hepatitis B infection. Hepatitis C Blood testing is recommended for: Everyone born from 1945 through 1965. Anyone with known risk factors for hepatitis C. Sexually transmitted infections (STIs) You should be screened each year for STIs, including gonorrhea and chlamydia, if: You are sexually active and are younger than 68 years of age. You are older than 68 years of age and your   health care provider tells you that you are at risk for this type of infection. Your sexual activity has changed since you were last screened, and you are at increased risk for chlamydia or gonorrhea. Ask your health care provider if you are at risk. Ask your health care provider about whether you are at high risk for HIV. Your health care provider  may recommend a prescription medicine to help prevent HIV infection. If you choose to take medicine to prevent HIV, you should first get tested for HIV. You should then be tested every 3 months for as long as you are taking the medicine. Follow these instructions at home: Alcohol use Do not drink alcohol if your health care provider tells you not to drink. If you drink alcohol: Limit how much you have to 0-2 drinks a day. Know how much alcohol is in your drink. In the [place] one drink equals one 12 oz bottle of beer (355 mL), one 5 oz glass of wine (148 mL), or one 1 oz glass of hard liquor (44 mL). Lifestyle Do not use any products that contain nicotine or tobacco. These products include cigarettes, chewing tobacco, and vaping devices, such as e-cigarettes. If you need help quitting, ask your health care provider. Do not use street drugs. Do not share needles. Ask your health care provider for help if you need support or information about quitting drugs. General instructions Schedule regular health, dental, and eye exams. Stay current with your vaccines. Tell your health care provider if: You often feel depressed. You have ever been abused or do not feel safe at home. Summary Adopting a healthy lifestyle and getting preventive care are important in promoting health and wellness. Follow your health care provider's instructions about healthy diet, exercising, and getting tested or screened for diseases. Follow your health care provider's instructions on monitoring your cholesterol and blood pressure. This information is not intended to replace advice given to you by your health care provider. Make sure you discuss any questions you have with your health care provider. Document Revised: 08/19/2020 Document Reviewed: 08/19/2020 Elsevier Patient Education  2024 Elsevier Inc.  

## 2023-02-05 NOTE — Progress Notes (Signed)
Office Note 02/05/2023  CC:  Chief Complaint  Patient presents with   Annual Exam    Pt is fasting.     HPI:  Patient is a 67 y.o. male who is here for annual health maintenance exam. He is recovering well from right shoulder rotator cuff repair.  He is rehabbing his shoulder but also is doing lower body workouts.  These are pretty strenuous. He sometimes notes that he is lightheaded after working out his lower body but not when he does his upper body. The sense of lightheadedness is associated with a vague sense of vision abnormality but no presyncope or double vision.  He feels like it lasts 1 to 2 minutes.  No associated chest pain, headache, shortness of breath, diaphoresis, or nausea. He regularly wears a weighted vest and does long walks and has no symptoms with this.  Past Medical History:  Diagnosis Date   Allergic rhinitis    dust mites   BPH with obstruction/lower urinary tract symptoms    BPPV (benign paroxysmal positional vertigo)    COVID-19 virus infection 03/2019   prolonged post-covid DOE and HR variability.     Elevated PSA    Prostate bx 09/2018 NORMAL. PSA dec to 7.16 Apr 2019, then 5.14 October 2019.   Elevated TSH    2018, not taking biotin. 2020, same-->T4/T3 normal.   History of adenomatous polyp of colon    Hyperlipidemia    statin started 2022/2023   OSA (obstructive sleep apnea) 08/2019   sleep study->severe OSA->CPAP recommended   Prediabetes    10/2021 fasting glucose 116, hemoglobin A1c 5.7%   Pulmonary nodule Jan/feb 2021   5 mm R lung; pt low risk for lung ca so NO F/U IMAGING IS INDICATED.    Past Surgical History:  Procedure Laterality Date   CARDIOVASCULAR STRESS TEST  12/02/2021   Myocardial perfusion imaging normal, EF normal.   COLONOSCOPY  2012   2012 Normal.  10/2021 adenoma x 1->recall 5 yrs   polysomnogram  08/25/2019   severe OSA->CPAP recommended   PROSTATE BIOPSY  09/2018   NORMAL   SHOULDER SURGERY Right 09/29/2022   RC  repair   TRANSTHORACIC ECHOCARDIOGRAM     12/23/21 EF 60 to 65%.  Grade 1 diastolic dysfunction.   ZIO patch  01/26/2020   NORMAL    Family History  Problem Relation Age of Onset   Hearing loss Mother    Hyperlipidemia Mother    Irritable bowel syndrome Sister    Other Brother        elevated PSA   Diabetes Maternal Grandfather     Social History   Socioeconomic History   Marital status: Married    Spouse name: Not on file   Number of children: 2   Years of education: Not on file   Highest education level: Bachelor's degree (e.g., BA, AB, BS)  Occupational History   Occupation: Art gallery manager  Tobacco Use   Smoking status: Never   Smokeless tobacco: Never  Vaping Use   Vaping status: Never Used  Substance and Sexual Activity   Alcohol use: Yes    Comment: occasionally   Drug use: Never   Sexual activity: Not on file  Other Topics Concern   Not on file  Social History Narrative   Married, 2 children.   Orig from [place], relocated to [facility] 2018.   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   No tob.   Alc: 2 glasses of wine  per night.   Social Determinants of Health   Financial Resource Strain: Low Risk  (02/05/2023)   Overall Financial Resource Strain (CARDIA)    Difficulty of Paying Living Expenses: Not hard at all  Food Insecurity: No Food Insecurity (02/05/2023)   Hunger Vital Sign    Worried About Running Out of Food in the Last Year: Never true    Ran Out of Food in the Last Year: Never true  Transportation Needs: No Transportation Needs (02/05/2023)   PRAPARE - Administrator, Civil Service (Medical): No    Lack of Transportation (Non-Medical): No  Physical Activity: Sufficiently Active (02/05/2023)   Exercise Vital Sign    Days of Exercise per Week: 4 days    Minutes of Exercise per Session: 60 min  Recent Concern: Physical Activity - Insufficiently Active (11/18/2022)   Exercise Vital Sign    Days of Exercise per Week: 2 days     Minutes of Exercise per Session: 60 min  Stress: Stress Concern Present (02/05/2023)   [employer] of Occupational Health - Occupational Stress Questionnaire    Feeling of Stress : To some extent  Social Connections: Moderately Isolated (02/05/2023)   Social Connection and Isolation Panel [NHANES]    Frequency of Communication with Friends and Family: Three times a week    Frequency of Social Gatherings with Friends and Family: Three times a week    Attends Religious Services: Never    Active Member of Clubs or Organizations: No    Attends Banker Meetings: Never    Marital Status: Married  Catering manager Violence: Not At Risk (11/18/2022)   Humiliation, Afraid, Rape, and Kick questionnaire    Fear of Current or Ex-Partner: No    Emotionally Abused: No    Physically Abused: No    Sexually Abused: No    Outpatient Medications Prior to Visit  Medication Sig Dispense Refill   Cholecalciferol (VITAMIN D3) 50 MCG (2000 UT) capsule Take 2,000 Units by mouth daily.     ferrous sulfate 325 (65 FE) MG EC tablet Take 325 mg by mouth daily.     fluticasone (FLONASE) 50 MCG/ACT nasal spray INSTILL 2 SPRAYS INTO EACH NOSTRIL EVERY DAY 16 g 1   Multiple Vitamin (MULTIVITAMIN) tablet Take 1 tablet by mouth daily.     silodosin (RAPAFLO) 8 MG CAPS capsule TAKE ONE CAPSULE BY MOUTH DAILY AT BEDTIME 90 capsule 0   Facility-Administered Medications Prior to Visit  Medication Dose Route Frequency Provider Last Rate Last Admin   regadenoson (LEXISCAN) injection SOLN 0.4 mg  0.4 mg Intravenous Once Lewayne Bunting, MD        Allergies  Allergen Reactions   Atorvastatin     Indigestion, trouble sleeping   Tamsulosin Other (See Comments)    lightheadedness    Review of Systems  Constitutional:  Negative for appetite change, chills, fatigue and fever.  HENT:  Negative for congestion, dental problem, ear pain and sore throat.   Eyes:  Negative for discharge, redness and  visual disturbance.  Respiratory:  Negative for cough, chest tightness, shortness of breath and wheezing.   Cardiovascular:  Negative for chest pain, palpitations and leg swelling.  Gastrointestinal:  Negative for abdominal pain, blood in stool, diarrhea, nausea and vomiting.  Genitourinary:  Negative for difficulty urinating, dysuria, flank pain, frequency, hematuria and urgency.  Musculoskeletal:  Negative for arthralgias, back pain, joint swelling, myalgias and neck stiffness.  Skin:  Negative for pallor and rash.  Neurological:  Positive for light-headedness. Negative for dizziness, speech difficulty, weakness and headaches.  Hematological:  Negative for adenopathy. Does not bruise/bleed easily.  Psychiatric/Behavioral:  Negative for confusion and sleep disturbance. The patient is not nervous/anxious.     PE;    02/05/2023    7:59 AM 01/13/2023   10:54 AM 11/18/2022   11:11 AM  Vitals with BMI  Height 5' 10.5" 5\' 9"    Weight 184 lbs 13 oz 184 lbs 179 lbs  BMI 26.13 27.16   Systolic 126 142   Diastolic 77 82   Pulse 61 57     Gen: Alert, well appearing.  Patient is oriented to person, place, time, and situation. AFFECT: pleasant, lucid thought and speech. ENT: Ears: EACs clear, normal epithelium.  TMs with good light reflex and landmarks bilaterally.  Eyes: no injection, icteris, swelling, or exudate.  EOMI, PERRLA. Nose: no drainage or turbinate edema/swelling.  No injection or focal lesion.  Mouth: lips without lesion/swelling.  Oral mucosa pink and moist.  Dentition intact and without obvious caries or gingival swelling.  Oropharynx without erythema, exudate, or swelling.  Neck: supple/nontender.  No LAD, mass, or TM.  Carotid pulses 2+ bilaterally, without bruits. CV: RRR, no m/r/g.   LUNGS: CTA bilat, nonlabored resps, good aeration in all lung fields. ABD: soft, NT, ND, BS normal.  No hepatospenomegaly or mass.  No bruits. EXT: no clubbing, cyanosis, or edema.   Musculoskeletal: no joint swelling, erythema, warmth, or tenderness.  ROM of all joints intact. Skin - no sores or suspicious lesions or rashes or color changes  Pertinent labs:  Lab Results  Component Value Date   TSH 3.08 11/03/2021   Lab Results  Component Value Date   WBC 4.0 11/03/2021   HGB 14.0 11/03/2021   HCT 41.5 11/03/2021   MCV 98.6 11/03/2021   PLT 310.0 11/03/2021   Lab Results  Component Value Date   CREATININE 1.06 11/03/2021   BUN 14 11/03/2021   NA 137 11/03/2021   K 4.4 11/03/2021   CL 104 11/03/2021   CO2 27 11/03/2021   Lab Results  Component Value Date   ALT 22 11/03/2021   AST 28 11/03/2021   ALKPHOS 44 11/03/2021   BILITOT 0.9 11/03/2021   Lab Results  Component Value Date   CHOL 203 (H) 11/03/2021   Lab Results  Component Value Date   HDL 80.50 11/03/2021   Lab Results  Component Value Date   LDLCALC 112 (H) 11/03/2021   Lab Results  Component Value Date   TRIG 48.0 11/03/2021   Lab Results  Component Value Date   CHOLHDL 3 11/03/2021   Lab Results  Component Value Date   PSA 5.08 (H) 11/03/2021   PSA 4.88 11/14/2020   PSA 5.03 10/24/2019   Lab Results  Component Value Date   HGBA1C 5.7 11/04/2021   ASSESSMENT AND PLAN:   #1 health maintenance exam: Reviewed age and gender appropriate health maintenance issues (prudent diet, regular exercise, health risks of tobacco and excessive alcohol, use of seatbelts, fire alarms in home, use of sunscreen).  Also reviewed age and gender appropriate health screening as well as vaccine recommendations. Vaccines: All UTD. Labs: cbc, cmet, flp, thyroid panel (Hx of subclin hypothyr), PSA. Prostate ca screening: PSAs followed by urology b/c hx of elev PSA but I'll check this today.   Biopsy benign in the past.  He is due for annual follow-up with his urologist. Colon ca screening: no polyps 2012 ([place] of [place]) and  2023 TCS ([place])-->recall 2028.  #2 lightheadedness. Only occurs with  lower body workouts, but not every time he works out. I do not know what is causing this. No red flags. Observe and return if worsening or new symptoms.  An After Visit Summary was printed and given to the patient.  FOLLOW UP:  Return in about 1 year (around 02/05/2024) for annual CPE (fasting).  Signed:  Santiago Bumpers, MD           02/05/2023

## 2023-04-13 LAB — PSA: PSA: 6.29

## 2023-04-21 ENCOUNTER — Other Ambulatory Visit: Payer: Self-pay | Admitting: Urology

## 2023-04-21 DIAGNOSIS — R972 Elevated prostate specific antigen [PSA]: Secondary | ICD-10-CM

## 2023-04-22 ENCOUNTER — Encounter: Payer: Self-pay | Admitting: Urology

## 2023-05-05 ENCOUNTER — Ambulatory Visit
Admission: RE | Admit: 2023-05-05 | Discharge: 2023-05-05 | Disposition: A | Discharge status: Home / Self Care | Payer: Medicare HMO | Source: Ambulatory Visit | Attending: Urology | Admitting: Urology

## 2023-05-05 DIAGNOSIS — R972 Elevated prostate specific antigen [PSA]: Secondary | ICD-10-CM | POA: Diagnosis not present

## 2023-05-05 MED ORDER — GADOPICLENOL 0.5 MMOL/ML IV SOLN
10.0000 mL | Freq: Once | INTRAVENOUS | Status: AC | PRN
  Administered 2023-05-05: 8 mL via INTRAVENOUS

## 2023-05-12 DIAGNOSIS — R972 Elevated prostate specific antigen [PSA]: Secondary | ICD-10-CM | POA: Diagnosis not present

## 2023-05-12 DIAGNOSIS — N401 Enlarged prostate with lower urinary tract symptoms: Secondary | ICD-10-CM | POA: Diagnosis not present

## 2023-05-12 DIAGNOSIS — N5201 Erectile dysfunction due to arterial insufficiency: Secondary | ICD-10-CM | POA: Diagnosis not present

## 2023-05-12 DIAGNOSIS — R351 Nocturia: Secondary | ICD-10-CM | POA: Diagnosis not present

## 2023-06-14 DIAGNOSIS — R351 Nocturia: Secondary | ICD-10-CM | POA: Diagnosis not present

## 2023-06-14 DIAGNOSIS — N401 Enlarged prostate with lower urinary tract symptoms: Secondary | ICD-10-CM | POA: Diagnosis not present

## 2023-07-14 DIAGNOSIS — N401 Enlarged prostate with lower urinary tract symptoms: Secondary | ICD-10-CM | POA: Diagnosis not present

## 2023-07-14 DIAGNOSIS — R351 Nocturia: Secondary | ICD-10-CM | POA: Diagnosis not present

## 2023-08-31 ENCOUNTER — Ambulatory Visit (HOSPITAL_BASED_OUTPATIENT_CLINIC_OR_DEPARTMENT_OTHER)
Admission: RE | Admit: 2023-08-31 | Discharge: 2023-08-31 | Disposition: A | Discharge status: Home / Self Care | Source: Ambulatory Visit | Attending: Urgent Care | Admitting: Urgent Care

## 2023-08-31 ENCOUNTER — Ambulatory Visit: Payer: Self-pay | Admitting: Urgent Care

## 2023-08-31 ENCOUNTER — Telehealth: Payer: Self-pay

## 2023-08-31 ENCOUNTER — Ambulatory Visit (INDEPENDENT_AMBULATORY_CARE_PROVIDER_SITE_OTHER): Admitting: Urgent Care

## 2023-08-31 VITALS — BP 108/55 | HR 86 | Temp 98.7°F | Wt 185.0 lb

## 2023-08-31 DIAGNOSIS — R051 Acute cough: Secondary | ICD-10-CM | POA: Diagnosis not present

## 2023-08-31 DIAGNOSIS — R7981 Abnormal blood-gas level: Secondary | ICD-10-CM

## 2023-08-31 DIAGNOSIS — J189 Pneumonia, unspecified organism: Secondary | ICD-10-CM

## 2023-08-31 DIAGNOSIS — R509 Fever, unspecified: Secondary | ICD-10-CM

## 2023-08-31 DIAGNOSIS — U071 COVID-19: Secondary | ICD-10-CM

## 2023-08-31 DIAGNOSIS — R059 Cough, unspecified: Secondary | ICD-10-CM | POA: Diagnosis not present

## 2023-08-31 DIAGNOSIS — J181 Lobar pneumonia, unspecified organism: Secondary | ICD-10-CM | POA: Diagnosis not present

## 2023-08-31 DIAGNOSIS — R918 Other nonspecific abnormal finding of lung field: Secondary | ICD-10-CM | POA: Diagnosis not present

## 2023-08-31 DIAGNOSIS — A4189 Other specified sepsis: Secondary | ICD-10-CM | POA: Diagnosis not present

## 2023-08-31 LAB — POC COVID19 BINAXNOW: SARS Coronavirus 2 Ag: POSITIVE — AB

## 2023-08-31 MED ORDER — AZITHROMYCIN 250 MG PO TABS: ORAL_TABLET | ORAL | 0 refills | Status: DC

## 2023-08-31 MED ORDER — NIRMATRELVIR/RITONAVIR (PAXLOVID)TABLET: 3.0000 | ORAL_TABLET | Freq: Two times a day (BID) | ORAL | 0 refills | Status: DC

## 2023-08-31 NOTE — Progress Notes (Signed)
 Established Patient Office Visit  Subjective:  Patient ID: Christopher Banks, male    DOB: 12-08-1955  Age: 68 y.o. MRN: [id number]  Chief Complaint  Patient presents with   Chills    Fever, chills, dizziness and cough that started on Saturday. Pt stated his symptoms got worse yesterday.    68yo male presents with c/o 4d hx of fever, productive cough, chills, myalgias, temporal headache with sensation of lightheadedness and night sweats. Sx started mild on Saturday. Fever was 101.8 tmax. Sunday sx also mild, but yesterday worsened and last night pt developed night sweats. He denies sinus pain, ear pain, sore throat, CP, palpitations or GI sx. He has been taking ibuprofen at night to help with sx, no other tx tried. Pt wife has the same symptoms. They recently travelled to [facility].     Patient Active Problem List   Diagnosis Date Noted   OSA on CPAP 01/11/2023   Traumatic tear of supraspinatus tendon of right shoulder 07/21/2022   Median nerve neuritis, right 06/10/2022   COVID-19 04/04/2019   Past Medical History:  Diagnosis Date   Allergic rhinitis    dust mites   BPH with obstruction/lower urinary tract symptoms    BPPV (benign paroxysmal positional vertigo)    COVID-19 virus infection 03/2019   prolonged post-covid DOE and HR variability.     Elevated PSA    Prostate bx 09/2018 NORMAL. PSA dec to 7.16 Apr 2019, then 5.14 October 2019.   Elevated TSH    2018, not taking biotin. 2020, same-->T4/T3 normal.   History of adenomatous polyp of colon    Hyperlipidemia    statin started 2022/2023   OSA (obstructive sleep apnea) 08/2019   sleep study->severe OSA->CPAP recommended   Prediabetes    10/2021 fasting glucose 116, hemoglobin A1c 5.7%   Pulmonary nodule Jan/feb 2021   5 mm R lung; pt low risk for lung ca so NO F/U IMAGING IS INDICATED.   Past Surgical History:  Procedure Laterality Date   CARDIOVASCULAR STRESS TEST  12/02/2021   Myocardial perfusion imaging normal, EF  normal.   COLONOSCOPY  2012   2012 Normal.  10/2021 adenoma x 1->recall 5 yrs   polysomnogram  08/25/2019   severe OSA->CPAP recommended   PROSTATE BIOPSY  09/2018   NORMAL   SHOULDER SURGERY Right 09/29/2022   RC repair   TRANSTHORACIC ECHOCARDIOGRAM     12/23/21 EF 60 to 65%.  Grade 1 diastolic dysfunction.   ZIO patch  01/26/2020   NORMAL   Social History   Tobacco Use   Smoking status: Never   Smokeless tobacco: Never  Vaping Use   Vaping status: Never Used  Substance Use Topics   Alcohol use: Yes    Comment: occasionally   Drug use: Never      ROS: as noted in HPI  Objective:     BP (!) 108/55   Pulse 86   Temp 98.7 F (37.1 C) (Oral)   Wt 185 lb (83.9 kg)   SpO2 92%   BMI 26.17 kg/m  BP Readings from Last 3 Encounters:  08/31/23 (!) 108/55  02/05/23 126/77  01/13/23 (!) 142/82   Wt Readings from Last 3 Encounters:  08/31/23 185 lb (83.9 kg)  02/05/23 184 lb 12.8 oz (83.8 kg)  01/13/23 184 lb (83.5 kg)      Physical Exam Vitals and nursing note reviewed.  Constitutional:      General: He is not in acute distress.  Appearance: Normal appearance. He is ill-appearing. He is not diaphoretic.  HENT:     Head: Normocephalic and atraumatic.     Right Ear: Tympanic membrane, ear canal and external ear normal. There is no impacted cerumen.     Left Ear: Tympanic membrane, ear canal and external ear normal. There is no impacted cerumen.     Nose: Congestion and rhinorrhea present. Rhinorrhea is clear.     Right Sinus: No maxillary sinus tenderness or frontal sinus tenderness.     Left Sinus: No maxillary sinus tenderness or frontal sinus tenderness.     Mouth/Throat:     Lips: Pink.     Mouth: Mucous membranes are moist.     Pharynx: Oropharynx is clear. Uvula midline. No pharyngeal swelling, oropharyngeal exudate, posterior oropharyngeal erythema, uvula swelling or postnasal drip.  Eyes:     General: No scleral icterus.       Right eye: No discharge.         Left eye: No discharge.     Extraocular Movements: Extraocular movements intact.     Pupils: Pupils are equal, round, and reactive to light.  Cardiovascular:     Rate and Rhythm: Normal rate and regular rhythm.     Heart sounds: No murmur heard. Pulmonary:     Effort: Pulmonary effort is normal. No respiratory distress.     Breath sounds: No stridor. Rhonchi (coarse breath sounds noted to left upper lobe posteriorly) present. No wheezing or rales.  Musculoskeletal:     Cervical back: Normal range of motion and neck supple. No rigidity or tenderness.  Lymphadenopathy:     Cervical: No cervical adenopathy.  Skin:    General: Skin is warm and dry.     Coloration: Skin is not jaundiced.     Findings: No bruising, erythema or rash.  Neurological:     General: No focal deficit present.     Mental Status: He is alert and oriented to person, place, and time.  Psychiatric:        Mood and Affect: Mood normal.        Behavior: Behavior normal.      Results for orders placed or performed in visit on 08/31/23  POC COVID-19 BinaxNow  Result Value Ref Range   SARS Coronavirus 2 Ag Positive (A) Negative    Last CBC Lab Results  Component Value Date   WBC 4.0 02/05/2023   HGB 13.9 02/05/2023   HCT 41.9 02/05/2023   MCV 99.3 02/05/2023   MCH 32.5 04/27/2019   RDW 13.5 02/05/2023   PLT 270.0 02/05/2023   Last metabolic panel Lab Results  Component Value Date   GLUCOSE 119 (H) 02/05/2023   NA 138 02/05/2023   K 4.2 02/05/2023   CL 103 02/05/2023   CO2 30 02/05/2023   BUN 9 02/05/2023   CREATININE 0.99 02/05/2023   GFR 78.63 02/05/2023   CALCIUM  9.3 02/05/2023   PROT 6.8 02/05/2023   ALBUMIN 4.1 02/05/2023   BILITOT 0.5 02/05/2023   ALKPHOS 54 02/05/2023   AST 28 02/05/2023   ALT 19 02/05/2023      The 10-year ASCVD risk score (Arnett DK, et al., 2019) is: 9.8%  Assessment & Plan:  Fever, unspecified fever cause -     POC COVID-19 BinaxNow -     DG Chest 2  View; Future  Acute cough -     POC COVID-19 BinaxNow -     DG Chest 2 View; Future  Low O2 saturation -  POC COVID-19 BinaxNow -     DG Chest 2 View; Future  COVID-19 -     POC COVID-19 BinaxNow -     nirmatrelvir/ritonavir; Take 3 tablets by mouth 2 (two) times daily for 5 days. (Take nirmatrelvir 150 mg two tablets twice daily for 5 days and ritonavir 100 mg one tablet twice daily for 5 days) Patient GFR is 78  Dispense: 30 tablet; Refill: 0 -     DG Chest 2 View; Future   Pt on day 4 of symptoms for covid, still within treatment window. Discussed treatment goals, risks, benefits, medication side effects. OTC medicaitons also discussed. Due to cough with c/o mild SOB and low O2 noted on pulse ox, will send for stat CXR to r/o LUL pneumonia.  No follow-ups on file.   Mandy Second, PA

## 2023-08-31 NOTE — Telephone Encounter (Signed)
 Copied from CRM [id number]. Topic: Clinical - Medication Question >> Aug 31, 2023 12:31 PM Freya Jesus wrote: Reason for CRM: Patient is requesting clarity on the medication: nirmatrelvir/ritonavir (PAXLOVID) 20 x 150 MG & 10 x 100MG  TABS [[id number]] because he said it cause 300.

## 2023-08-31 NOTE — Patient Instructions (Addendum)
 You are positive for covid.  Please read the attached information regarding covid and paxlovid. Take paxlovid 3 pills twice daily for 5 days. Common side effects include diarrhea or nausea and mild headache.  Please rest and drink plenty of water. You can purchased over the counter oscillococcinum - this helps with body aches. Over the counter Quercetin with zinc helps boost your natural immune system to fight off the virus.  Please go to [facility] to get your chest xray. Address: [address] [place], [place] [address] Phone: [phone]

## 2023-09-01 ENCOUNTER — Emergency Department (HOSPITAL_BASED_OUTPATIENT_CLINIC_OR_DEPARTMENT_OTHER): Admitting: Radiology

## 2023-09-01 ENCOUNTER — Inpatient Hospital Stay (HOSPITAL_BASED_OUTPATIENT_CLINIC_OR_DEPARTMENT_OTHER)
Admission: EM | Admit: 2023-09-01 | Discharge: 2023-09-03 | DRG: 871 | Disposition: A | Discharge status: Home / Self Care | Attending: Internal Medicine | Admitting: Internal Medicine

## 2023-09-01 ENCOUNTER — Other Ambulatory Visit: Payer: Self-pay

## 2023-09-01 ENCOUNTER — Encounter (HOSPITAL_BASED_OUTPATIENT_CLINIC_OR_DEPARTMENT_OTHER): Payer: Self-pay | Admitting: Emergency Medicine

## 2023-09-01 DIAGNOSIS — G4733 Obstructive sleep apnea (adult) (pediatric): Secondary | ICD-10-CM | POA: Diagnosis present

## 2023-09-01 DIAGNOSIS — U071 COVID-19: Secondary | ICD-10-CM | POA: Diagnosis present

## 2023-09-01 DIAGNOSIS — E871 Hypo-osmolality and hyponatremia: Secondary | ICD-10-CM | POA: Diagnosis present

## 2023-09-01 DIAGNOSIS — A419 Sepsis, unspecified organism: Secondary | ICD-10-CM

## 2023-09-01 DIAGNOSIS — N179 Acute kidney failure, unspecified: Secondary | ICD-10-CM | POA: Diagnosis not present

## 2023-09-01 DIAGNOSIS — Z8616 Personal history of COVID-19: Secondary | ICD-10-CM

## 2023-09-01 DIAGNOSIS — J181 Lobar pneumonia, unspecified organism: Secondary | ICD-10-CM | POA: Diagnosis present

## 2023-09-01 DIAGNOSIS — J189 Pneumonia, unspecified organism: Secondary | ICD-10-CM | POA: Diagnosis not present

## 2023-09-01 DIAGNOSIS — J1282 Pneumonia due to coronavirus disease 2019: Secondary | ICD-10-CM | POA: Diagnosis present

## 2023-09-01 DIAGNOSIS — E861 Hypovolemia: Secondary | ICD-10-CM | POA: Diagnosis present

## 2023-09-01 DIAGNOSIS — R7303 Prediabetes: Secondary | ICD-10-CM | POA: Diagnosis present

## 2023-09-01 DIAGNOSIS — R918 Other nonspecific abnormal finding of lung field: Secondary | ICD-10-CM | POA: Diagnosis not present

## 2023-09-01 DIAGNOSIS — A4189 Other specified sepsis: Principal | ICD-10-CM | POA: Diagnosis present

## 2023-09-01 DIAGNOSIS — E785 Hyperlipidemia, unspecified: Secondary | ICD-10-CM | POA: Diagnosis present

## 2023-09-01 LAB — COMPREHENSIVE METABOLIC PANEL WITH GFR
ALT: 33 U/L (ref 0–44)
AST: 58 U/L — ABNORMAL HIGH (ref 15–41)
Albumin: 3.9 g/dL (ref 3.5–5.0)
Alkaline Phosphatase: 82 U/L (ref 38–126)
Anion gap: 11 (ref 5–15)
BUN: 21 mg/dL (ref 8–23)
CO2: 24 mmol/L (ref 22–32)
Calcium: 9.3 mg/dL (ref 8.9–10.3)
Chloride: 90 mmol/L — ABNORMAL LOW (ref 98–111)
Creatinine, Ser: 1.47 mg/dL — ABNORMAL HIGH (ref 0.61–1.24)
GFR, Estimated: 52 mL/min — ABNORMAL LOW (ref >60)
Glucose, Bld: 158 mg/dL — ABNORMAL HIGH (ref 70–99)
Potassium: 3.8 mmol/L (ref 3.5–5.1)
Sodium: 124 mmol/L — ABNORMAL LOW (ref 135–145)
Total Bilirubin: 1.2 mg/dL (ref 0.0–1.2)
Total Protein: 7.2 g/dL (ref 6.5–8.1)

## 2023-09-01 LAB — URINALYSIS, W/ REFLEX TO CULTURE (INFECTION SUSPECTED)
Bacteria, UA: NONE SEEN
Bilirubin Urine: NEGATIVE
Glucose, UA: NEGATIVE mg/dL
Ketones, ur: 15 mg/dL — AB
Leukocytes,Ua: NEGATIVE
Nitrite: NEGATIVE
Protein, ur: 100 mg/dL — AB
Specific Gravity, Urine: 1.026 (ref 1.005–1.030)
pH: 6 (ref 5.0–8.0)

## 2023-09-01 LAB — CBC WITH DIFFERENTIAL/PLATELET
Abs Immature Granulocytes: 0.02 10*3/uL (ref 0.00–0.07)
Basophils Absolute: 0 10*3/uL (ref 0.0–0.1)
Basophils Relative: 0 %
Eosinophils Absolute: 0.2 10*3/uL (ref 0.0–0.5)
Eosinophils Relative: 4 %
HCT: 40.4 % (ref 39.0–52.0)
Hemoglobin: 13.9 g/dL (ref 13.0–17.0)
Immature Granulocytes: 0 %
Lymphocytes Relative: 11 %
Lymphs Abs: 0.6 10*3/uL — ABNORMAL LOW (ref 0.7–4.0)
MCH: 32.5 pg (ref 26.0–34.0)
MCHC: 34.4 g/dL (ref 30.0–36.0)
MCV: 94.4 fL (ref 80.0–100.0)
Monocytes Absolute: 0.3 10*3/uL (ref 0.1–1.0)
Monocytes Relative: 6 %
Neutro Abs: 4.3 10*3/uL (ref 1.7–7.7)
Neutrophils Relative %: 79 %
Platelets: 153 10*3/uL (ref 150–400)
RBC: 4.28 MIL/uL (ref 4.22–5.81)
RDW: 12.3 % (ref 11.5–15.5)
WBC: 5.5 10*3/uL (ref 4.0–10.5)
nRBC: 0 % (ref 0.0–0.2)

## 2023-09-01 LAB — RESP PANEL BY RT-PCR (RSV, FLU A&B, COVID)  RVPGX2
Influenza A by PCR: NEGATIVE
Influenza B by PCR: NEGATIVE
Resp Syncytial Virus by PCR: NEGATIVE
SARS Coronavirus 2 by RT PCR: NEGATIVE

## 2023-09-01 LAB — LACTIC ACID, PLASMA: Lactic Acid, Venous: 1.4 mmol/L (ref 0.5–1.9)

## 2023-09-01 LAB — HIV ANTIBODY (ROUTINE TESTING W REFLEX): HIV Screen 4th Generation wRfx: NONREACTIVE

## 2023-09-01 LAB — PROTIME-INR
INR: 1.1 (ref 0.8–1.2)
Prothrombin Time: 14.6 s (ref 11.4–15.2)

## 2023-09-01 LAB — PROCALCITONIN: Procalcitonin: 1.9 ng/mL

## 2023-09-01 MED ORDER — ACETAMINOPHEN 500 MG PO TABS
1000.0000 mg | ORAL_TABLET | Freq: Once | ORAL | Status: AC
  Administered 2023-09-01: 1000 mg via ORAL
  Filled 2023-09-01: qty 2

## 2023-09-01 MED ORDER — GUAIFENESIN-DM 100-10 MG/5ML PO SYRP: 5.0000 mL | ORAL_SOLUTION | ORAL | Status: DC | PRN

## 2023-09-01 MED ORDER — LACTATED RINGERS IV SOLN: INTRAVENOUS | Status: DC

## 2023-09-01 MED ORDER — SODIUM CHLORIDE 0.9 % IV BOLUS
1000.0000 mL | Freq: Once | INTRAVENOUS | Status: AC
  Administered 2023-09-01: 1000 mL via INTRAVENOUS

## 2023-09-01 MED ORDER — ACETAMINOPHEN 325 MG PO TABS: 650.0000 mg | ORAL_TABLET | Freq: Four times a day (QID) | ORAL | Status: DC | PRN

## 2023-09-01 MED ORDER — SODIUM CHLORIDE 0.9 % IV SOLN
2.0000 g | Freq: Once | INTRAVENOUS | Status: AC
  Administered 2023-09-01: 2 g via INTRAVENOUS
  Filled 2023-09-01: qty 20

## 2023-09-01 MED ORDER — CEFTRIAXONE SODIUM 1 G IJ SOLR
1.0000 g | INTRAMUSCULAR | Status: DC
  Administered 2023-09-02: 1 g via INTRAVENOUS
  Filled 2023-09-01 (×2): qty 10

## 2023-09-01 MED ORDER — SODIUM CHLORIDE 0.9 % IV SOLN
500.0000 mg | Freq: Once | INTRAVENOUS | Status: AC
  Administered 2023-09-01: 500 mg via INTRAVENOUS
  Filled 2023-09-01: qty 5

## 2023-09-01 MED ORDER — ACETAMINOPHEN 650 MG RE SUPP: 650.0000 mg | Freq: Four times a day (QID) | RECTAL | Status: DC | PRN

## 2023-09-01 MED ORDER — ENOXAPARIN SODIUM 40 MG/0.4ML IJ SOSY
40.0000 mg | PREFILLED_SYRINGE | INTRAMUSCULAR | Status: DC
  Administered 2023-09-01 – 2023-09-02 (×2): 40 mg via SUBCUTANEOUS
  Filled 2023-09-01 (×2): qty 0.4

## 2023-09-01 MED ORDER — SODIUM CHLORIDE 0.9 % IV SOLN: INTRAVENOUS | Status: DC | PRN

## 2023-09-01 MED ORDER — IPRATROPIUM-ALBUTEROL 0.5-2.5 (3) MG/3ML IN SOLN: 3.0000 mL | Freq: Four times a day (QID) | RESPIRATORY_TRACT | Status: DC | PRN

## 2023-09-01 MED ORDER — SODIUM CHLORIDE 0.9 % IV SOLN
500.0000 mg | INTRAVENOUS | Status: DC
  Administered 2023-09-02: 500 mg via INTRAVENOUS
  Filled 2023-09-01 (×2): qty 5

## 2023-09-01 MED ORDER — LACTATED RINGERS IV BOLUS (SEPSIS)
1000.0000 mL | Freq: Once | INTRAVENOUS | Status: DC
  Administered 2023-09-01: 1000 mL via INTRAVENOUS

## 2023-09-01 MED ORDER — DIPHENHYDRAMINE HCL 25 MG PO CAPS
25.0000 mg | ORAL_CAPSULE | Freq: Every evening | ORAL | Status: DC | PRN
  Administered 2023-09-01: 25 mg via ORAL
  Filled 2023-09-01: qty 1

## 2023-09-01 NOTE — H&P (Signed)
 History and Physical    Patient: Christopher Banks ZOX:[id number] DOB: 04/30/1955 DOA: 09/01/2023 DOS: the patient was seen and examined on 09/01/2023 PCP: Shelvia Dick, MD  Patient coming from: Home > [facility] > [facility]  Chief Complaint:  Chief Complaint  Patient presents with   Shortness of Breath   HPI: Lenix Kidd is a 68 y.o. male with medical history significant of OSA on CPAP, prediabetes, HLD, recent diagnosis of COVID-19 presenting to the ED with [facility]  Patient reports that he began feeling symptoms of congestion and productive cough since last Friday. His symptoms continued to worsen over the weekend and became really noticeable on Monday. He reports cough has been productive of clearing/yellow sputum. He does also endorse fevers at home with Tmax of 101.42F. Also endorses associated night sweats. He had a COVID test done yesterday that was positive. He was prescribed azithromycin and paxlovid by his PCP but has not taken these. Given symptoms continued to worsen, he was prompted to come to the ED for further evaluation. He denies any chills, nausea, vomiting, chest pain, palpitations, abdominal pain, urinary changes. Does endorse mild SHOB with exertion over the past few days. States that his PO intake has been decreased this week as well.  ED course: Vitals notable for fever of 101.33F, tachypnea, and borderline low BP (100s-120s/50s).  CBC unremarkable.  CMP with sodium 124, glucose 158, creatinine 1.47 (baseline around 1), AST 58.  Lactic acid normal.  Urinalysis with proteinuria and mild ketonuria, otherwise unremarkable.  Respiratory panel today negative for COVID, flu, RSV.  Chest x-ray showing likely right lower lobar pneumonia, worsened compared to outpatient chest x-ray yesterday.  Blood cultures collected and pending.  Patient given 2 L IV fluid bolus, azithromycin and ceftriaxone in the ED.  [facility] hospitalist asked to evaluate patient for admission.  Review of Systems:  As mentioned in the history of present illness. All other systems reviewed and are negative. Past Medical History:  Diagnosis Date   Allergic rhinitis    dust mites   BPH with obstruction/lower urinary tract symptoms    BPPV (benign paroxysmal positional vertigo)    COVID-19 virus infection 03/2019   prolonged post-covid DOE and HR variability.     Elevated PSA    Prostate bx 09/2018 NORMAL. PSA dec to 7.16 Apr 2019, then 5.14 October 2019.   Elevated TSH    2018, not taking biotin. 2020, same-->T4/T3 normal.   History of adenomatous polyp of colon    Hyperlipidemia    statin started 2022/2023   OSA (obstructive sleep apnea) 08/2019   sleep study->severe OSA->CPAP recommended   Prediabetes    10/2021 fasting glucose 116, hemoglobin A1c 5.7%   Pulmonary nodule Jan/feb 2021   5 mm R lung; pt low risk for lung ca so NO F/U IMAGING IS INDICATED.   Past Surgical History:  Procedure Laterality Date   CARDIOVASCULAR STRESS TEST  12/02/2021   Myocardial perfusion imaging normal, EF normal.   COLONOSCOPY  2012   2012 Normal.  10/2021 adenoma x 1->recall 5 yrs   polysomnogram  08/25/2019   severe OSA->CPAP recommended   PROSTATE BIOPSY  09/2018   NORMAL   SHOULDER SURGERY Right 09/29/2022   RC repair   TRANSTHORACIC ECHOCARDIOGRAM     12/23/21 EF 60 to 65%.  Grade 1 diastolic dysfunction.   ZIO patch  01/26/2020   NORMAL   Social History:  reports that he has never smoked. He has never used smokeless tobacco. He reports current  alcohol use. He reports that he does not use drugs.  Allergies  Allergen Reactions   Atorvastatin      Indigestion, trouble sleeping   Tamsulosin Other (See Comments)    lightheadedness    Family History  Problem Relation Age of Onset   Hearing loss Mother    Hyperlipidemia Mother    Irritable bowel syndrome Sister    Other Brother        elevated PSA   Diabetes Maternal Grandfather     Prior to Admission medications   Medication Sig Start Date End  Date Taking? Authorizing Provider  azithromycin (ZITHROMAX) 250 MG tablet Take 2 tablets on day 1, then 1 tablet daily on days 2 through 5 08/31/23 09/05/23 Yes Crain, Whitney L, PA  Cholecalciferol (VITAMIN D3) 50 MCG (2000 UT) capsule Take 2,000 Units by mouth daily.   Yes [provider]  ferrous sulfate 325 (65 FE) MG EC tablet Take 325 mg by mouth daily.   Yes [provider]  fluticasone (FLONASE) 50 MCG/ACT nasal spray INSTILL 2 SPRAYS INTO EACH NOSTRIL EVERY DAY 10/24/18  Yes McGowen, Minetta Aly, MD  Multiple Vitamin (MULTIVITAMIN) tablet Take 1 tablet by mouth daily.   Yes [provider]  nirmatrelvir/ritonavir (PAXLOVID) 20 x 150 MG & 10 x 100MG  TABS Take 3 tablets by mouth 2 (two) times daily for 5 days. (Take nirmatrelvir 150 mg two tablets twice daily for 5 days and ritonavir 100 mg one tablet twice daily for 5 days) Patient GFR is 78 Patient not taking: Reported on 09/01/2023 08/31/23 09/05/23  Mandy Second, [place]    Physical Exam: Vitals:   09/01/23 1320 09/01/23 1445 09/01/23 1449 09/01/23 1551  BP:  (!) 121/57  102/78  Pulse:  76  83  Resp:  (!) 24  20  Temp: 98.6 F (37 C)  99 F (37.2 C) 98.5 F (36.9 C)  TempSrc: Oral  Oral Oral  SpO2:  95%  96%  Weight:      Height:       Physical Exam Constitutional:      Appearance: Normal appearance. He is well-developed. He is not ill-appearing.  HENT:     Head: Normocephalic and atraumatic.     Mouth/Throat:     Mouth: Mucous membranes are dry.     Pharynx: Oropharynx is clear. No oropharyngeal exudate.  Eyes:     General: No scleral icterus.    Extraocular Movements: Extraocular movements intact.     Conjunctiva/sclera: Conjunctivae normal.     Pupils: Pupils are equal, round, and reactive to light.  Cardiovascular:     Rate and Rhythm: Normal rate and regular rhythm.     Heart sounds: Normal heart sounds. No murmur heard.    No friction rub. No gallop.  Pulmonary:     Effort: Pulmonary effort  is normal. No respiratory distress.     Breath sounds: No wheezing or rhonchi.     Comments: Mild crackles at right lung base. Saturating >92% on RA. Abdominal:     General: Bowel sounds are normal. There is no distension.     Palpations: Abdomen is soft.     Tenderness: There is no abdominal tenderness. There is no guarding or rebound.  Musculoskeletal:        General: No swelling. Normal range of motion.     Cervical back: Normal range of motion.  Skin:    General: Skin is warm and dry.  Neurological:     General: No focal  deficit present.     Mental Status: He is alert and oriented to person, place, and time.  Psychiatric:        Mood and Affect: Mood normal.        Behavior: Behavior normal.     Data Reviewed:  There are no new results to review at this time.    Latest Ref Rng & Units 09/01/2023    9:43 AM 02/05/2023    8:20 AM 11/03/2021    8:32 AM  CBC  WBC 4.0 - 10.5 K/uL 5.5  4.0  4.0   Hemoglobin 13.0 - 17.0 g/dL 40.9  81.1  91.4   Hematocrit 39.0 - 52.0 % 40.4  41.9  41.5   Platelets 150 - 400 K/uL 153  270.0  310.0       Latest Ref Rng & Units 09/01/2023    9:43 AM 02/05/2023    8:20 AM 11/03/2021    8:32 AM  CMP  Glucose 70 - 99 mg/dL 782  956  213   BUN 8 - 23 mg/dL 21  9  14    Creatinine 0.61 - 1.24 mg/dL 0.86  5.78  4.69   Sodium 135 - 145 mmol/L 124  138  137   Potassium 3.5 - 5.1 mmol/L 3.8  4.2  4.4   Chloride 98 - 111 mmol/L 90  103  104   CO2 22 - 32 mmol/L 24  30  27    Calcium  8.9 - 10.3 mg/dL 9.3  9.3  9.5   Total Protein 6.5 - 8.1 g/dL 7.2  6.8  6.7   Total Bilirubin 0.0 - 1.2 mg/dL 1.2  0.5  0.9   Alkaline Phos 38 - 126 U/L 82  54  44   AST 15 - 41 U/L 58  28  28   ALT 0 - 44 U/L 33  19  22    Lactic Acid, Venous    Component Value Date/Time   LATICACIDVEN 1.4 09/01/2023 0943   Urinalysis    Component Value Date/Time   COLORURINE YELLOW 09/01/2023 0943   APPEARANCEUR CLEAR 09/01/2023 0943   LABSPEC 1.026 09/01/2023 0943   PHURINE  6.0 09/01/2023 0943   GLUCOSEU NEGATIVE 09/01/2023 0943   HGBUR TRACE (A) 09/01/2023 0943   BILIRUBINUR NEGATIVE 09/01/2023 0943   KETONESUR 15 (A) 09/01/2023 0943   PROTEINUR 100 (A) 09/01/2023 0943   NITRITE NEGATIVE 09/01/2023 0943   LEUKOCYTESUR NEGATIVE 09/01/2023 0943    DG Chest 2 View Result Date: 09/01/2023 CLINICAL DATA:  Sepsis. EXAM: CHEST - 2 VIEW COMPARISON:  X-ray 08/31/2023 FINDINGS: Significant increase in parenchymal opacity identified in the right mid to lower lung, lower lobe. No pneumothorax, effusion or edema. Normal cardiopericardial silhouette. Degenerative changes along the spine and shoulders. IMPRESSION: Significant interval increase in parenchymal opacities along the right mid lower lung particularly the right lower lobe. Infiltrate or pneumonia. Recommend follow-up. Electronically Signed   By: Adrianna Horde M.D.   On: 09/01/2023 12:14    Assessment and Plan: No notes have been filed under this hospital service. Service: Hospitalist  Sepsis 2/2 CAP COVID-19 infection Patient presenting with shortness of breath with exertion for the past few days along with productive cough, fevers, congestion over the past week.  He was found to be COVID-19 positive on outpatient testing yesterday.  Repeat testing in the ED today negative for COVID-19. Will presume he is COVID-19 positive given positive testing yesterday and presence of symptoms consistent with viral prodrome. He is saturating  well on RA. His outpatient vitals over the past 6 months with BP ranging in the 110s-120s systolics and thus BP is only mildly low compared to baseline here in the ED. He does meet sepsis criteria with fever, tachypnea, mildly low BP, and AKI. Fortunately with normal lactic acid level. CXR does show a left lower lobar pneumonia, worsened in comparison to outpatient CXR yesterday. Will continue on CAP coverage for now and will check procalcitonin level to see if antibiotics can be stopped.  -IV  rocephin and azithromycin -s/p 2L IVF bolus, now on mIVF at 100cc/hr for 12 more hours -robitussin DM q4h prn for cough -tylenol q6h prn for fever -f/u blood cultures, procalcitonin -supplemental O2 prn to maintain O2 sats >92% - Trend fever and BP curve, goal MAP greater than 65 - Place in observation - initially placed in MedSurg but will change to progressive floor given presence of sepsis for closer monitoring - PT with ambulatory O2 sats - Lovenox for DVT prophylaxis  AKI Baseline creatinine around 1, on admission creatinine 1.47.  This is likely a prerenal etiology given decreased p.o. intake in the outpatient setting and presentation with sepsis secondary to pneumonia.  He has received 2 L IV fluid bolus and is currently on maintenance IV fluids. - Maintenance IV fluids at 100 cc an hour for 12 more hours - Status post 2 L IV fluid bolus - Follow-up renal ultrasound to rule out obstructive uropathy - Trend kidney function, monitor urine output - Avoid nephrotoxic medications as able  Hypovolemic hyponatremia Sodium 124 on CMP today, suspect secondary to hypovolemia as noted above.  He has received 2 L IV fluid bolus and is currently on maintenance IV fluids. - Trend sodium level on CMP tomorrow - IV Fluids as noted above  Elevated liver enzyme AST 58 on CMP today.  Suspect mild elevation in liver enzyme secondary to sepsis as noted above. - Trend liver enzymes on CMP tomorrow  OSA -resume home CPAP  Prediabetes A1c 5.6% on labs 6 months ago.  Blood glucose 158 on CMP today.  Will check CBGs every morning.  He is not on any medications for this in the outpatient setting. - Trend CBGs every morning, goal 140-180 - Consider adding SSI if CBGs above goal  HLD -not on statin therapy in the outpatient setting   Advance Care Planning:   Code Status: Full Code   Consults: none  Family Communication: no family at bedside  Severity of Illness: The appropriate patient  status for this patient is OBSERVATION. Observation status is judged to be reasonable and necessary in order to provide the required intensity of service to ensure the patient's safety. The patient's presenting symptoms, physical exam findings, and initial radiographic and laboratory data in the context of their medical condition is felt to place them at decreased risk for further clinical deterioration. Furthermore, it is anticipated that the patient will be medically stable for discharge from the hospital within 2 midnights of admission.   Portions of this note were generated with Scientist, clinical (histocompatibility and immunogenetics). Dictation errors may occur despite best attempts at proofreading.  Author: Dajaun Goldring H Shanique Aslinger, MD 09/01/2023 4:40 PM  For on call review [email or web address].

## 2023-09-01 NOTE — Consult Note (Signed)
 Called [facility] for hospitalist (kim)

## 2023-09-01 NOTE — Telephone Encounter (Signed)
 Called and spoke with patient to answer any questions. He told me that today he was feeling more short of breath and that his home pulse ox was registering at 88%. I told him to immediately go to the ER for a further workup and evaluation. Pt stated understanding and will head to ER now.

## 2023-09-01 NOTE — ED Triage Notes (Signed)
 Dx with COVID, chest xray showed pneumonia. Pulse ox at home was 88%. Reports SOB

## 2023-09-01 NOTE — ED Notes (Signed)
 Triage order set not placed. EDP at bedside and states will place orders.

## 2023-09-01 NOTE — Sepsis Progress Note (Signed)
 Code Sepsis protocol being monitored by [facility].

## 2023-09-01 NOTE — Plan of Care (Signed)
  Problem: Education: Goal: Knowledge of General Education information will improve Description: Including pain rating scale, medication(s)/side effects and non-pharmacologic comfort measures Outcome: Progressing   Problem: Clinical Measurements: Goal: Ability to maintain clinical measurements within normal limits will improve Outcome: Progressing Goal: Will remain free from infection Outcome: Progressing Goal: Diagnostic test results will improve Outcome: Progressing Goal: Respiratory complications will improve Outcome: Progressing Goal: Cardiovascular complication will be avoided Outcome: Progressing   Problem: Activity: Goal: Risk for activity intolerance will decrease Outcome: Progressing   Problem: Nutrition: Goal: Adequate nutrition will be maintained Outcome: Progressing   Problem: Elimination: Goal: Will not experience complications related to bowel motility Outcome: Progressing Goal: Will not experience complications related to urinary retention Outcome: Progressing   Problem: Pain Managment: Goal: General experience of comfort will improve and/or be controlled Outcome: Progressing   Problem: Safety: Goal: Ability to remain free from injury will improve Outcome: Progressing

## 2023-09-01 NOTE — Telephone Encounter (Signed)
**Note De-identified  [place] Obfuscation** Please advise 

## 2023-09-01 NOTE — Telephone Encounter (Signed)
 No further action needed.

## 2023-09-01 NOTE — ED Provider Notes (Signed)
 [place] EMERGENCY DEPARTMENT AT [facility] Provider Note   CSN: [id number] Arrival date & time: 09/01/23  4098     History  Chief Complaint  Patient presents with   Shortness of Breath   HPI Christopher Banks is a 68 y.o. male presenting for shortness of breath.  He was diagnosed with COVID yesterday.  For the last 3 to 4 days he has had fever chills, cough also diarrhea.  Denies chest pain.  He checked his pulse ox at home and saw that it was 88% which is very low for him.  Shortness of breath is worse with exertion and not lying flat.  Denies lower extremity edema.  Went to his PCP yesterday and x-ray did show concern for possible pneumonia as well.  Past Medical History:  Diagnosis Date   Allergic rhinitis    dust mites   BPH with obstruction/lower urinary tract symptoms    BPPV (benign paroxysmal positional vertigo)    COVID-19 virus infection 03/2019   prolonged post-covid DOE and HR variability.     Elevated PSA    Prostate bx 09/2018 NORMAL. PSA dec to 7.16 Apr 2019, then 5.14 October 2019.   Elevated TSH    2018, not taking biotin. 2020, same-->T4/T3 normal.   History of adenomatous polyp of colon    Hyperlipidemia    statin started 2022/2023   OSA (obstructive sleep apnea) 08/2019   sleep study->severe OSA->CPAP recommended   Prediabetes    10/2021 fasting glucose 116, hemoglobin A1c 5.7%   Pulmonary nodule Jan/feb 2021   5 mm R lung; pt low risk for lung ca so NO F/U IMAGING IS INDICATED.      Shortness of Breath      Home Medications Prior to Admission medications   Medication Sig Start Date End Date Taking? Authorizing Provider  azithromycin (ZITHROMAX) 250 MG tablet Take 2 tablets on day 1, then 1 tablet daily on days 2 through 5 08/31/23 09/05/23 Yes Crain, Whitney L, PA  Cholecalciferol (VITAMIN D3) 50 MCG (2000 UT) capsule Take 2,000 Units by mouth daily.   Yes [provider]  ferrous sulfate 325 (65 FE) MG EC tablet Take 325 mg by mouth  daily.   Yes [provider]  fluticasone (FLONASE) 50 MCG/ACT nasal spray INSTILL 2 SPRAYS INTO EACH NOSTRIL EVERY DAY 10/24/18  Yes McGowen, Minetta Aly, MD  Multiple Vitamin (MULTIVITAMIN) tablet Take 1 tablet by mouth daily.   Yes [provider]  nirmatrelvir/ritonavir (PAXLOVID) 20 x 150 MG & 10 x 100MG  TABS Take 3 tablets by mouth 2 (two) times daily for 5 days. (Take nirmatrelvir 150 mg two tablets twice daily for 5 days and ritonavir 100 mg one tablet twice daily for 5 days) Patient GFR is 78 Patient not taking: Reported on 09/01/2023 08/31/23 09/05/23  Corita Diego L, PA      Allergies    Atorvastatin  and Tamsulosin    Review of Systems   Review of Systems  Respiratory:  Positive for shortness of breath.     Physical Exam Updated Vital Signs BP (!) 138/110   Pulse 71   Temp 98.6 F (37 C) (Oral)   Resp (!) 27   Ht 5\' 10"  (1.778 m)   Wt 79.4 kg   SpO2 94%   BMI 25.11 kg/m  Physical Exam Vitals and nursing note reviewed.  HENT:     Head: Normocephalic and atraumatic.     Mouth/Throat:     Mouth: Mucous membranes are moist.  Eyes:     General:        Right eye: No discharge.        Left eye: No discharge.     Conjunctiva/sclera: Conjunctivae normal.  Cardiovascular:     Rate and Rhythm: Normal rate and regular rhythm.     Pulses: Normal pulses.     Heart sounds: Normal heart sounds.  Pulmonary:     Effort: Pulmonary effort is normal. Tachypnea present.     Breath sounds: Examination of the right-upper field reveals rales. Examination of the right-middle field reveals rales. Examination of the right-lower field reveals rales. Rales present. No decreased breath sounds, wheezing or rhonchi.  Abdominal:     General: Abdomen is flat.     Palpations: Abdomen is soft.  Skin:    General: Skin is warm and dry.  Neurological:     General: No focal deficit present.  Psychiatric:        Mood and Affect: Mood normal.     ED Results / Procedures /  Treatments   Labs (all labs ordered are listed, but only abnormal results are displayed) Labs Reviewed  COMPREHENSIVE METABOLIC PANEL WITH GFR - Abnormal; Notable for the following components:      Result Value   Sodium 124 (*)    Chloride 90 (*)    Glucose, Bld 158 (*)    Creatinine, Ser 1.47 (*)    AST 58 (*)    GFR, Estimated 52 (*)    All other components within normal limits  CBC WITH DIFFERENTIAL/PLATELET - Abnormal; Notable for the following components:   Lymphs Abs 0.6 (*)    All other components within normal limits  URINALYSIS, W/ REFLEX TO CULTURE (INFECTION SUSPECTED) - Abnormal; Notable for the following components:   Hgb urine dipstick TRACE (*)    Ketones, ur 15 (*)    Protein, ur 100 (*)    All other components within normal limits  RESP PANEL BY RT-PCR (RSV, FLU A&B, COVID)  [id number]  CULTURE, BLOOD (ROUTINE X 2)  CULTURE, BLOOD (ROUTINE X 2)  LACTIC ACID, PLASMA  PROTIME-INR    EKG EKG Interpretation Date/Time:  Wednesday Sep 01 2023 09:50:34 EDT Ventricular Rate:  83 PR Interval:  136 QRS Duration:  84 QT Interval:  331 QTC Calculation: 389 R Axis:   52  Text Interpretation: Sinus rhythm no prior ECG for comparison NO STEMI Confirmed by Wynell Heath ([id number]) on 09/01/2023 12:54:41 PM  Radiology DG Chest 2 View Result Date: 09/01/2023 CLINICAL DATA:  Sepsis. EXAM: CHEST - 2 VIEW COMPARISON:  X-ray 08/31/2023 FINDINGS: Significant increase in parenchymal opacity identified in the right mid to lower lung, lower lobe. No pneumothorax, effusion or edema. Normal cardiopericardial silhouette. Degenerative changes along the spine and shoulders. IMPRESSION: Significant interval increase in parenchymal opacities along the right mid lower lung particularly the right lower lobe. Infiltrate or pneumonia. Recommend follow-up. Electronically Signed   By: Adrianna Horde M.D.   On: 09/01/2023 12:14   DG Chest 2 View Result Date: 08/31/2023 CLINICAL DATA:  Cough and fever  EXAM: CHEST - 2 VIEW COMPARISON:  X-ray 04/27/2019 FINDINGS: No pneumothorax, effusion or edema. Normal cardiopericardial silhouette. Subtle opacity in the right lower lobe. A subtle infiltrates possible. Recommend follow-up. Degenerative changes of the spine and shoulders. IMPRESSION: Subtle opacity in the right lower lobe. Subtle infiltrate is possible. Recommend follow-up. Electronically Signed   By: Adrianna Horde M.D.   On: 08/31/2023 12:54    Procedures .Critical Care  Performed by: Janalee Mcmurray, PA-C Authorized by: Janalee Mcmurray, PA-C   Critical care provider statement:    Critical care time (minutes):  30   Critical care was necessary to treat or prevent imminent or life-threatening deterioration of the following conditions:  Sepsis (Sepsis secondary pneumonia)   Critical care was time spent personally by me on the following activities:  Development of treatment plan with patient or surrogate, discussions with consultants, evaluation of patient's response to treatment, examination of patient, ordering and review of laboratory studies, ordering and review of radiographic studies, ordering and performing treatments and interventions, pulse oximetry, re-evaluation of patient's condition and review of old charts     Medications Ordered in ED Medications  lactated ringers infusion (has no administration in time range)  0.9 %  sodium chloride infusion (has no administration in time range)  cefTRIAXone (ROCEPHIN) 2 g in sodium chloride 0.9 % 100 mL IVPB (0 g Intravenous Stopped 09/01/23 1036)  azithromycin (ZITHROMAX) 500 mg in sodium chloride 0.9 % 250 mL IVPB (0 mg Intravenous Stopped 09/01/23 1151)  acetaminophen (TYLENOL) tablet 1,000 mg (1,000 mg Oral Given 09/01/23 0952)  sodium chloride 0.9 % bolus 1,000 mL (0 mLs Intravenous Stopped 09/01/23 1217)  sodium chloride 0.9 % bolus 1,000 mL (1,000 mLs Intravenous New Bag/Given 09/01/23 1257)    ED Course/ Medical Decision Making/ A&P                                  Medical Decision Making Amount and/or Complexity of Data Reviewed Labs: ordered. Radiology: ordered.  Risk OTC drugs. Prescription drug management. Decision regarding hospitalization.   Initial Impression and Ddx 68 year old well-appearing male presenting for shortness of breath in setting of cough and possible pneumonia.  Exam notable for fever, tachypnea, rales on the right side.  DDx includes sepsis, pneumonia, respiratory failure, pneumothorax, CHF exacerbation, PE, other. Patient PMH that increases complexity of ED encounter:  none  Interpretation of Diagnostics - I independent reviewed and interpreted the labs as followed: hyponatremia, reduced GFR  - I independently visualized the following imaging with scope of interpretation limited to determining acute life threatening conditions related to emergency care: CXR, which revealed interval worsening of right sided opacities suggestive of pneumonia  -I personally reviewed and interpreted EKG which revealed sinus rhythm and nonischemic  Patient Reassessment and Ultimate Disposition/Management Work up of all suggestive of sepsis secondary to pneumonia.  Code sepsis was activated.  Patient is receiving IV antibiotics and fluids.  Admitted to hospital service with Dr. Jerilynn Montenegro.   Patient management required discussion with the following services or consulting groups:  Hospitalist Service  Complexity of Problems Addressed Acute complicated illness or Injury  Additional Data Reviewed and Analyzed Further history obtained from: Past medical history and medications listed in the EMR and Prior ED visit notes  Patient Encounter Risk Assessment Consideration of hospitalization         Final Clinical Impression(s) / ED Diagnoses Final diagnoses:  Pneumonia of right lung due to infectious organism, unspecified part of lung    Rx / DC Orders ED Discharge Orders     None          Janalee Mcmurray, PA-C 09/01/23 1353    Tegeler, Marine Sia, MD 09/02/23 1735

## 2023-09-01 NOTE — Progress Notes (Signed)
 Plan of Care Note for accepted transfer   Patient: Chino Sardo MRN: [id number]   DOA: 09/01/2023  Facility [facility] transfer: [facility] Requesting Provider: Valentina Gasman, PA Reason for transfer: pneumonia Facility course:  68 year old man diagnosed with COVID 5/20 in the outpatient setting, presented to ED today with shortness of breath.  Reportedly hypoxic as an outpatient, not hypoxic in the emergency department.  Chest x-ray showed interval worsening of pneumonia.  Treated with antibiotics, given fever, referred for observation.  Of note while outpatient antigen COVID test was positive yesterday, ED COVID testing negative today.  Febrile 101.1, hemodynamics stable, respiratory rate in the 20s, no hypoxia documented. Sodium 124, creatinine 1.47 COVID-negative Chest x-ray with right-sided pneumonia EKG sinus rhythm  Plan of care: The patient is accepted for admission to Med-surg  unit, at [facility]..   Patient remains under the care of EDP until arrival at [facility]  Author: Jerline Moon, MD 09/01/2023  Check [email or web address] for on-call coverage.  Nursing staff, Please call [facility] Admits & Consults System-Wide number on Amion as soon as patient's arrival, so appropriate admitting provider can evaluate the pt.

## 2023-09-01 NOTE — ED Notes (Signed)
 Called [facility] at [facility] for transport

## 2023-09-02 DIAGNOSIS — E785 Hyperlipidemia, unspecified: Secondary | ICD-10-CM | POA: Diagnosis not present

## 2023-09-02 DIAGNOSIS — U071 COVID-19: Secondary | ICD-10-CM | POA: Diagnosis not present

## 2023-09-02 DIAGNOSIS — E871 Hypo-osmolality and hyponatremia: Secondary | ICD-10-CM | POA: Diagnosis not present

## 2023-09-02 DIAGNOSIS — J181 Lobar pneumonia, unspecified organism: Secondary | ICD-10-CM | POA: Diagnosis not present

## 2023-09-02 DIAGNOSIS — N179 Acute kidney failure, unspecified: Secondary | ICD-10-CM | POA: Diagnosis not present

## 2023-09-02 DIAGNOSIS — E861 Hypovolemia: Secondary | ICD-10-CM | POA: Diagnosis not present

## 2023-09-02 DIAGNOSIS — G4733 Obstructive sleep apnea (adult) (pediatric): Secondary | ICD-10-CM | POA: Diagnosis not present

## 2023-09-02 DIAGNOSIS — J1282 Pneumonia due to coronavirus disease 2019: Secondary | ICD-10-CM | POA: Diagnosis not present

## 2023-09-02 DIAGNOSIS — A4189 Other specified sepsis: Secondary | ICD-10-CM | POA: Diagnosis not present

## 2023-09-02 DIAGNOSIS — R7303 Prediabetes: Secondary | ICD-10-CM | POA: Diagnosis not present

## 2023-09-02 DIAGNOSIS — Z8616 Personal history of COVID-19: Secondary | ICD-10-CM | POA: Diagnosis not present

## 2023-09-02 LAB — COMPREHENSIVE METABOLIC PANEL WITH GFR
ALT: 23 U/L (ref 0–44)
AST: 32 U/L (ref 15–41)
Albumin: 3 g/dL — ABNORMAL LOW (ref 3.5–5.0)
Alkaline Phosphatase: 48 U/L (ref 38–126)
Anion gap: 9 (ref 5–15)
BUN: 14 mg/dL (ref 8–23)
CO2: 19 mmol/L — ABNORMAL LOW (ref 22–32)
Calcium: 8.4 mg/dL — ABNORMAL LOW (ref 8.9–10.3)
Chloride: 99 mmol/L (ref 98–111)
Creatinine, Ser: 0.97 mg/dL (ref 0.61–1.24)
GFR, Estimated: >60 mL/min (ref >60)
Glucose, Bld: 113 mg/dL — ABNORMAL HIGH (ref 70–99)
Potassium: 3.5 mmol/L (ref 3.5–5.1)
Sodium: 127 mmol/L — ABNORMAL LOW (ref 135–145)
Total Bilirubin: 1.3 mg/dL — ABNORMAL HIGH (ref 0.0–1.2)
Total Protein: 6.5 g/dL (ref 6.5–8.1)

## 2023-09-02 LAB — OSMOLALITY, URINE: Osmolality, Ur: 675 mosm/kg (ref 300–900)

## 2023-09-02 LAB — CBC
HCT: 38.2 % — ABNORMAL LOW (ref 39.0–52.0)
Hemoglobin: 12.6 g/dL — ABNORMAL LOW (ref 13.0–17.0)
MCH: 32.6 pg (ref 26.0–34.0)
MCHC: 33 g/dL (ref 30.0–36.0)
MCV: 99 fL (ref 80.0–100.0)
Platelets: 148 10*3/uL — ABNORMAL LOW (ref 150–400)
RBC: 3.86 MIL/uL — ABNORMAL LOW (ref 4.22–5.81)
RDW: 12.4 % (ref 11.5–15.5)
WBC: 6.3 10*3/uL (ref 4.0–10.5)
nRBC: 0 % (ref 0.0–0.2)

## 2023-09-02 LAB — SODIUM, URINE, RANDOM: Sodium, Ur: 12 mmol/L

## 2023-09-02 LAB — GLUCOSE, CAPILLARY
Glucose-Capillary: 124 mg/dL — ABNORMAL HIGH (ref 70–99)
Glucose-Capillary: 157 mg/dL — ABNORMAL HIGH (ref 70–99)
Glucose-Capillary: 180 mg/dL — ABNORMAL HIGH (ref 70–99)

## 2023-09-02 LAB — OSMOLALITY: Osmolality: 270 mosm/kg — ABNORMAL LOW (ref 275–295)

## 2023-09-02 LAB — BRAIN NATRIURETIC PEPTIDE: B Natriuretic Peptide: 474.6 pg/mL — ABNORMAL HIGH (ref 0.0–100.0)

## 2023-09-02 MED ORDER — IPRATROPIUM-ALBUTEROL 0.5-2.5 (3) MG/3ML IN SOLN: 3.0000 mL | RESPIRATORY_TRACT | Status: DC | PRN

## 2023-09-02 MED ORDER — SENNOSIDES-DOCUSATE SODIUM 8.6-50 MG PO TABS: 1.0000 | ORAL_TABLET | Freq: Every evening | ORAL | Status: DC | PRN

## 2023-09-02 MED ORDER — METOPROLOL TARTRATE 5 MG/5ML IV SOLN: 5.0000 mg | INTRAVENOUS | Status: DC | PRN

## 2023-09-02 MED ORDER — FLUTICASONE PROPIONATE 50 MCG/ACT NA SUSP
1.0000 | Freq: Every day | NASAL | Status: DC
  Administered 2023-09-02 – 2023-09-03 (×2): 1 via NASAL
  Filled 2023-09-02: qty 16

## 2023-09-02 MED ORDER — BUDESONIDE 0.5 MG/2ML IN SUSP
0.5000 mg | Freq: Two times a day (BID) | RESPIRATORY_TRACT | Status: DC
  Administered 2023-09-02 – 2023-09-03 (×2): 0.5 mg via RESPIRATORY_TRACT
  Filled 2023-09-02 (×3): qty 2

## 2023-09-02 MED ORDER — IPRATROPIUM-ALBUTEROL 0.5-2.5 (3) MG/3ML IN SOLN
3.0000 mL | Freq: Two times a day (BID) | RESPIRATORY_TRACT | Status: DC
  Administered 2023-09-02 – 2023-09-03 (×2): 3 mL via RESPIRATORY_TRACT
  Filled 2023-09-02 (×2): qty 3

## 2023-09-02 MED ORDER — ONDANSETRON HCL 4 MG/2ML IJ SOLN: 4.0000 mg | Freq: Four times a day (QID) | INTRAMUSCULAR | Status: DC | PRN

## 2023-09-02 MED ORDER — ADULT MULTIVITAMIN W/MINERALS CH
1.0000 | ORAL_TABLET | Freq: Every day | ORAL | Status: DC
  Administered 2023-09-02 – 2023-09-03 (×2): 1 via ORAL
  Filled 2023-09-02 (×2): qty 1

## 2023-09-02 MED ORDER — VITAMIN D 25 MCG (1000 UNIT) PO TABS
2000.0000 [IU] | ORAL_TABLET | Freq: Every day | ORAL | Status: DC
  Administered 2023-09-02 – 2023-09-03 (×2): 2000 [IU] via ORAL
  Filled 2023-09-02 (×2): qty 2

## 2023-09-02 MED ORDER — ORAL CARE MOUTH RINSE: 15.0000 mL | OROMUCOSAL | Status: DC | PRN

## 2023-09-02 MED ORDER — IPRATROPIUM-ALBUTEROL 0.5-2.5 (3) MG/3ML IN SOLN
3.0000 mL | Freq: Four times a day (QID) | RESPIRATORY_TRACT | Status: DC
  Administered 2023-09-02: 3 mL via RESPIRATORY_TRACT
  Filled 2023-09-02: qty 3

## 2023-09-02 MED ORDER — HYDRALAZINE HCL 20 MG/ML IJ SOLN: 10.0000 mg | INTRAMUSCULAR | Status: DC | PRN

## 2023-09-02 MED ORDER — FERROUS SULFATE 325 (65 FE) MG PO TABS
325.0000 mg | ORAL_TABLET | Freq: Every day | ORAL | Status: DC
  Administered 2023-09-02 – 2023-09-03 (×2): 325 mg via ORAL
  Filled 2023-09-02 (×2): qty 1

## 2023-09-02 MED ORDER — PREDNISONE 50 MG PO TABS
50.0000 mg | ORAL_TABLET | Freq: Every day | ORAL | Status: DC
  Administered 2023-09-02 – 2023-09-03 (×2): 50 mg via ORAL
  Filled 2023-09-02 (×2): qty 1

## 2023-09-02 NOTE — Progress Notes (Signed)
 PROGRESS NOTE    Christopher Banks  WUJ:[id number] DOB: [birth year] DOA: 09/01/2023 PCP: Shelvia Dick, MD    Brief Narrative:  68 year old with history of OSA on CPAP, prediabetes, HLD, recent COVID diagnosis on 5/20 comes to the hospital with shortness of breath.  Patient states he started having URI type symptoms 5 days prior to admission with slowly worsening.  Also had fevers.  Went to his PCP who prescribed him azithromycin  and Paxlovid  but has not taken these.  In the ER noted to have sepsis secondary to COVID infection/pneumonia.  Started on azithromycin  and Rocephin .   Assessment & Plan:  Principal Problem:   Lobar pneumonia (HCC)   Sepsis 2/2 CAP COVID-19 infection Chest x-ray is concerning for left lower lobe pneumonia.  Also mildly elevated procalcitonin level.  This is likely superimposed infection along with COVID-19 - Continue Rocephin /azithromycin .  Add 5 days of prednisone  - Bronchodilators scheduled and as needed - Airborne precautions.  I-S/flutter valve - Elevated B NP but hold off on any further fluids    AKI Baseline creatinine 1.0, admission creatinine 1.47 now resolved with fluids.   Hypovolemic hyponatremia Admission sodium 124, morning labs are pending.  Continue IV fluids.  Will check urine sodium, serum and urine osmolality.   Elevated liver enzyme Mildly elevated AST.  Currently of no clinical significance, continue to monitor   OSA -resume home CPAP   Prediabetes A1c 5.6% on labs 6 months ago.  Currently continue to monitor   HLD -not on statin therapy in the outpatient setting     DVT prophylaxis: Lovenox     Code Status: Full Code Family Communication: Spouse at bedside Continue hospital stay for management of COVID-19 infection, shortness of breath and AKI    Subjective: Seen at bed side, still feels quite weak and with some dyspnea on exertion.  Does tell me he feels better compared to yesterday   Examination:  General exam:  Appears calm and comfortable  Respiratory system: Clear to auscultation. Respiratory effort normal. Cardiovascular system: S1 & S2 heard, RRR. No JVD, murmurs, rubs, gallops or clicks. No pedal edema. Gastrointestinal system: Abdomen is nondistended, soft and nontender. No organomegaly or masses felt. Normal bowel sounds heard. Central nervous system: Alert and oriented. No focal neurological deficits. Extremities: Symmetric 5 x 5 power. Skin: No rashes, lesions or ulcers Psychiatry: Judgement and insight appear normal. Mood & affect appropriate.                Diet Orders (From admission, onward)     Start     Ordered   09/01/23 1632  Diet regular Room service appropriate? Yes; Fluid consistency: Thin  Diet effective now       Question Answer Comment  Room service appropriate? Yes   Fluid consistency: Thin      09/01/23 1632            Objective: Vitals:   09/02/23 0236 09/02/23 0400 09/02/23 0559 09/02/23 0834  BP: (!) 108/53  (!) 113/48   Pulse: 70  71   Resp: 18 (!) 37 18   Temp: 98.9 F (37.2 C)  98.9 F (37.2 C)   TempSrc: Oral  Oral   SpO2: 90%  91% 94%  Weight:      Height:        Intake/Output Summary (Last 24 hours) at 09/02/2023 1056 Last data filed at 09/02/2023 0400 Gross per 24 hour  Intake 2962.82 ml  Output --  Net 2962.82 ml   [employer]  09/01/23 0928  Weight: 79.4 kg    Scheduled Meds:  budesonide (PULMICORT) nebulizer solution  0.5 mg Nebulization BID   cholecalciferol  2,000 Units Oral Daily   enoxaparin (LOVENOX) injection  40 mg Subcutaneous Q24H   ferrous sulfate  325 mg Oral Daily   fluticasone  1 spray Each Nare Daily   ipratropium-albuterol  3 mL Nebulization BID   multivitamin with minerals  1 tablet Oral Daily   predniSONE   50 mg Oral Q breakfast   Continuous Infusions:  azithromycin     cefTRIAXone (ROCEPHIN)  IV 1 g (09/02/23 1004)    Nutritional status     Body mass index is 25.11 kg/m.  Data  Reviewed:   CBC: Recent Labs  Lab 09/01/23 0943 09/02/23 0817  WBC 5.5 6.3  NEUTROABS 4.3  --   HGB 13.9 12.6*  HCT 40.4 38.2*  MCV 94.4 99.0  PLT 153 148*   Basic Metabolic Panel: Recent Labs  Lab 09/01/23 0943 09/02/23 0817  NA 124* 127*  K 3.8 3.5  CL 90* 99  CO2 24 19*  GLUCOSE 158* 113*  BUN 21 14  CREATININE 1.47* 0.97  CALCIUM  9.3 8.4*   GFR: Estimated Creatinine Clearance: 75.3 mL/min (by C-G formula based on SCr of 0.97 mg/dL). Liver Function Tests: Recent Labs  Lab 09/01/23 0943 09/02/23 0817  AST 58* 32  ALT 33 23  ALKPHOS 82 48  BILITOT 1.2 1.3*  PROT 7.2 6.5  ALBUMIN 3.9 3.0*   No results for input(s): "LIPASE", "AMYLASE" in the last 168 hours. No results for input(s): "AMMONIA" in the last 168 hours. Coagulation Profile: Recent Labs  Lab 09/01/23 0943  INR 1.1   Cardiac Enzymes: No results for input(s): "CKTOTAL", "CKMB", "CKMBINDEX", "TROPONINI" in the last 168 hours. BNP (last 3 results) No results for input(s): "PROBNP" in the last 8760 hours. HbA1C: No results for input(s): "HGBA1C" in the last 72 hours. CBG: Recent Labs  Lab 09/02/23 0738  GLUCAP 124*   Lipid Profile: No results for input(s): "CHOL", "HDL", "LDLCALC", "TRIG", "CHOLHDL", "LDLDIRECT" in the last 72 hours. Thyroid  Function Tests: No results for input(s): "TSH", "T4TOTAL", "FREET4", "T3FREE", "THYROIDAB" in the last 72 hours. Anemia Panel: No results for input(s): "VITAMINB12", "FOLATE", "FERRITIN", "TIBC", "IRON", "RETICCTPCT" in the last 72 hours. Sepsis Labs: Recent Labs  Lab 09/01/23 0943 09/01/23 1823  PROCALCITON  --  1.90  LATICACIDVEN 1.4  --     Recent Results (from the past 240 hours)  Blood Culture (routine x 2)     Status: None (Preliminary result)   Collection Time: 09/01/23  9:45 AM   Specimen: BLOOD  Result Value Ref Range Status   Specimen Description   Final    BLOOD RIGHT ANTECUBITAL Performed at [facility], [address], [place], [place] [address]    Special Requests   Final    BOTTLES DRAWN AEROBIC AND ANAEROBIC Blood Culture adequate volume Performed at [facility], [address] [place], [place] [address]    Culture   Final    NO GROWTH < 24 HOURS Performed at [facility], [address] [place], [place] [address]    Report Status PENDING  Incomplete  Resp panel by RT-PCR (RSV, Flu A&B, Covid) Anterior Nasal Swab     Status: None   Collection Time: 09/01/23  9:50 AM   Specimen: Anterior Nasal Swab  Result Value Ref Range Status   SARS Coronavirus 2 by RT PCR NEGATIVE NEGATIVE  Final    Comment: (NOTE) SARS-CoV-2 target nucleic acids are NOT DETECTED.  The SARS-CoV-2 RNA is generally detectable in upper respiratory specimens during the acute phase of infection. The lowest concentration of SARS-CoV-2 viral copies this assay can detect is 138 copies/mL. A negative result does not preclude SARS-Cov-2 infection and should not be used as the sole basis for treatment or other patient management decisions. A negative result may occur with  improper specimen collection/handling, submission of specimen other than nasopharyngeal swab, presence of viral mutation(s) within the areas targeted by this assay, and inadequate number of viral copies(<138 copies/mL). A negative result must be combined with clinical observations, patient history, and epidemiological information. The expected result is Negative.  Fact Sheet for Patients:  BloggerCourse.com  Fact Sheet for Healthcare Providers:  SeriousBroker.it  This test is no t yet approved or cleared by the [place]  FDA and  has been authorized for detection and/or diagnosis of SARS-CoV-2 by FDA under an Emergency Use Authorization (EUA). This EUA will remain  in effect (meaning this test can be used) for the duration of the COVID-19 declaration under Section  564(b)(1) of the Act, 21 U.S.C.section 360bbb-3(b)(1), unless the authorization is terminated  or revoked sooner.       Influenza A by PCR NEGATIVE NEGATIVE Final   Influenza B by PCR NEGATIVE NEGATIVE Final    Comment: (NOTE) The Xpert Xpress SARS-CoV-2/FLU/RSV plus assay is intended as an aid in the diagnosis of influenza from Nasopharyngeal swab specimens and should not be used as a sole basis for treatment. Nasal washings and aspirates are unacceptable for Xpert Xpress SARS-CoV-2/FLU/RSV testing.  Fact Sheet for Patients: BloggerCourse.com  Fact Sheet for Healthcare Providers: SeriousBroker.it  This test is not yet approved or cleared by the [place]  FDA and has been authorized for detection and/or diagnosis of SARS-CoV-2 by FDA under an Emergency Use Authorization (EUA). This EUA will remain in effect (meaning this test can be used) for the duration of the COVID-19 declaration under Section 564(b)(1) of the Act, 21 U.S.C. section 360bbb-3(b)(1), unless the authorization is terminated or revoked.     Resp Syncytial Virus by PCR NEGATIVE NEGATIVE Final    Comment: (NOTE) Fact Sheet for Patients: BloggerCourse.com  Fact Sheet for Healthcare Providers: SeriousBroker.it  This test is not yet approved or cleared by the [place]  FDA and has been authorized for detection and/or diagnosis of SARS-CoV-2 by FDA under an Emergency Use Authorization (EUA). This EUA will remain in effect (meaning this test can be used) for the duration of the COVID-19 declaration under Section 564(b)(1) of the Act, 21 U.S.C. section 360bbb-3(b)(1), unless the authorization is terminated or revoked.  Performed at [facility], [address] [place], [place] [address]   Blood Culture (routine x 2)     Status: None (Preliminary result)   Collection Time: 09/01/23   9:50 AM   Specimen: BLOOD  Result Value Ref Range Status   Specimen Description   Final    BLOOD LEFT ANTECUBITAL Performed at [facility], [address] [place], [place] [address]    Special Requests   Final    BOTTLES DRAWN AEROBIC AND ANAEROBIC Blood Culture adequate volume Performed at [facility], [address], [place], [place] [address]    Culture   Final    NO GROWTH < 24 HOURS Performed at [facility], [address], [place], [place] [address]    Report Status PENDING  Incomplete  Radiology Studies: DG Chest 2 View Result Date: 09/01/2023 CLINICAL DATA:  Sepsis. EXAM: CHEST - 2 VIEW COMPARISON:  X-ray 08/31/2023 FINDINGS: Significant increase in parenchymal opacity identified in the right mid to lower lung, lower lobe. No pneumothorax, effusion or edema. Normal cardiopericardial silhouette. Degenerative changes along the spine and shoulders. IMPRESSION: Significant interval increase in parenchymal opacities along the right mid lower lung particularly the right lower lobe. Infiltrate or pneumonia. Recommend follow-up. Electronically Signed   By: Adrianna Horde M.D.   On: 09/01/2023 12:14   DG Chest 2 View Result Date: 08/31/2023 CLINICAL DATA:  Cough and fever EXAM: CHEST - 2 VIEW COMPARISON:  X-ray 04/27/2019 FINDINGS: No pneumothorax, effusion or edema. Normal cardiopericardial silhouette. Subtle opacity in the right lower lobe. A subtle infiltrates possible. Recommend follow-up. Degenerative changes of the spine and shoulders. IMPRESSION: Subtle opacity in the right lower lobe. Subtle infiltrate is possible. Recommend follow-up. Electronically Signed   By: Adrianna Horde M.D.   On: 08/31/2023 12:54           LOS: 0 days   Time spent= 35 mins    Maggie Schooner, MD [facility]  If 7PM-7AM, please contact night-coverage  09/02/2023, 10:56 AM

## 2023-09-02 NOTE — Progress Notes (Signed)
 Pt placed on CPAP at this time with no O2 required. Pt is tolerating well. No distress noted. Will continue to monitor.     09/01/23 2234  BiPAP/CPAP/SIPAP  $ Non-Invasive Home Ventilator  Initial  $ Face Mask Medium Yes  BiPAP/CPAP/SIPAP Pt Type Adult  BiPAP/CPAP/SIPAP Resmed  Mask Type Full face mask  Dentures removed? Not applicable  Mask Size Medium  FiO2 (%) 21 %  Patient Home Machine No  Patient Home Mask No  Patient Home Tubing No  Auto Titrate Yes  Minimum cmH2O 10 cmH2O  Maximum cmH2O 4 cmH2O  CPAP/SIPAP surface wiped down Yes  Device Plugged into RED Power Outlet Yes  BiPAP/CPAP /SiPAP Vitals  Pulse Rate 79  Resp 20  SpO2 91 %  MEWS Score/Color  MEWS Score 0  MEWS Score Color Marrie Sizer

## 2023-09-02 NOTE — Progress Notes (Signed)
   09/02/23 1317  TOC Brief Assessment  Insurance and Status Reviewed  Patient has primary care physician Yes  Home environment has been reviewed Resides in single family home with spouse  Prior level of function: Independent with ADLs at baseline  Prior/Current Home Services No current home services  Social Drivers of Health Review SDOH reviewed no interventions necessary  Readmission risk has been reviewed Yes  Transition of care needs no transition of care needs at this time

## 2023-09-02 NOTE — Plan of Care (Signed)
  Problem: Education: Goal: Knowledge of General Education information will improve Description: Including pain rating scale, medication(s)/side effects and non-pharmacologic comfort measures 09/02/2023 1627 by Clemens Curt, Crawford Tamura H, RN Outcome: Progressing 09/02/2023 1627 by Clemens Curt, Akira Adelsberger H, RN Outcome: Progressing   Problem: Health Behavior/Discharge Planning: Goal: Ability to manage health-related needs will improve 09/02/2023 1627 by Clemens Curt, Jourdan Maldonado H, RN Outcome: Progressing 09/02/2023 1627 by Clemens Curt, Golden Gilreath H, RN Outcome: Progressing   Problem: Clinical Measurements: Goal: Ability to maintain clinical measurements within normal limits will improve 09/02/2023 1627 by Clemens Curt, Neilan Rizzo H, RN Outcome: Progressing 09/02/2023 1627 by Clemens Curt, Dionisios Ricci H, RN Outcome: Progressing Goal: Will remain free from infection 09/02/2023 1627 by Clemens Curt, Lakyra Tippins H, RN Outcome: Progressing 09/02/2023 1627 by Clemens Curt, Makaia Rappa H, RN Outcome: Progressing Goal: Diagnostic test results will improve 09/02/2023 1627 by Clemens Curt, Kavari Parrillo H, RN Outcome: Progressing 09/02/2023 1627 by Clemens Curt, Mattthew Ziomek H, RN Outcome: Progressing Goal: Respiratory complications will improve 09/02/2023 1627 by Clemens Curt, Cayleen Benjamin H, RN Outcome: Progressing 09/02/2023 1627 by Clemens Curt, Seth Higginbotham H, RN Outcome: Progressing Goal: Cardiovascular complication will be avoided 09/02/2023 1627 by Clemens Curt, Trentin Knappenberger H, RN Outcome: Progressing 09/02/2023 1627 by Clemens Curt, Jacaden Forbush H, RN Outcome: Progressing   Problem: Activity: Goal: Risk for activity intolerance will decrease 09/02/2023 1627 by Clemens Curt, Dashauna Heymann H, RN Outcome: Progressing 09/02/2023 1627 by Clemens Curt, Syvanna Ciolino H, RN Outcome: Progressing   Problem: Nutrition: Goal: Adequate nutrition will be maintained Outcome: Progressing   Problem: Coping: Goal: Level of anxiety will decrease Outcome: Progressing   Problem: Elimination: Goal: Will not  experience complications related to bowel motility Outcome: Progressing Goal: Will not experience complications related to urinary retention Outcome: Progressing   Problem: Pain Managment: Goal: General experience of comfort will improve and/or be controlled Outcome: Progressing   Problem: Safety: Goal: Ability to remain free from injury will improve Outcome: Progressing   Problem: Skin Integrity: Goal: Risk for impaired skin integrity will decrease Outcome: Progressing

## 2023-09-02 NOTE — Hospital Course (Addendum)
 Brief Narrative:  68 year old with history of OSA on CPAP, prediabetes, HLD, recent COVID diagnosis on 5/20 comes to the hospital with shortness of breath.  Patient states he started having URI type symptoms 5 days prior to admission with slowly worsening.  Also had fevers.  Went to his PCP who prescribed him azithromycin and Paxlovid but has not taken these.  In the ER noted to have sepsis secondary to COVID infection/pneumonia.  Started on azithromycin and Rocephin.  With supportive care, electrolyte repletion, steroids and antibiotic he felt significantly better.  Today he is medically stable for discharge.   Assessment & Plan:  Principal Problem:   Lobar pneumonia (HCC)   Sepsis 2/2 CAP COVID-19 infection Chest x-ray is concerning for left lower lobe pneumonia.  Also mildly elevated procalcitonin level.  This is likely superimposed infection along with COVID-19 - Continue Rocephin/azithromycin.  Add 5 days of prednisone .  Will transition to p.o. antibiotics.  Rescue inhaler has been prescribed. - His breathing has significantly improved today    AKI, solved Baseline creatinine 1.0, admission creatinine 1.47 now resolved with fluids.   Hypovolemic hyponatremia Admission sodium 124, improved with fluid.  Encourage oral hydration at home.  Repeat with PCP in about 1 week   Elevated liver enzyme Mildly elevated AST.  Currently of no clinical significance, continue to monitor   OSA -resume home CPAP   Prediabetes A1c 5.6% on labs 6 months ago.  Currently continue to monitor   HLD -not on statin therapy in the outpatient setting     DVT prophylaxis: Lovenox    Code Status: Full Code Family Communication: Spouse at bedside Discharge    Subjective: Seen at bedside, feels great today.  Wishing to go home.   Examination:  General exam: Appears calm and comfortable  Respiratory system: Clear to auscultation. Respiratory effort normal. Cardiovascular system: S1 & S2 heard,  RRR. No JVD, murmurs, rubs, gallops or clicks. No pedal edema. Gastrointestinal system: Abdomen is nondistended, soft and nontender. No organomegaly or masses felt. Normal bowel sounds heard. Central nervous system: Alert and oriented. No focal neurological deficits. Extremities: Symmetric 5 x 5 power. Skin: No rashes, lesions or ulcers Psychiatry: Judgement and insight appear normal. Mood & affect appropriate.

## 2023-09-02 NOTE — Evaluation (Signed)
 Physical Therapy Evaluation Patient Details Name: Christopher Banks MRN: [id number] DOB: 08/02/1955 Today's Date: 09/02/2023  History of Present Illness  Christopher Banks is a 68 y.o. male admitted 09/01/23 with lobar PNA with medical history significant of OSA on CPAP, prediabetes, HLD, recent diagnosis of COVID-19 presenting to the ED with SOB.  Clinical Impression  Pt admitted with above diagnosis. Pt in bed, spouse at bedside, able to come to sitting EOB when PT walks in. He is currently functioning ind, although he reports feeling some SOB with PLOF tasks such as working out, exercise with weighted pack, high level activity. Pt is able to amb x117ft and compelte 4 steps required for home entry and exit at ind level.  PT signing off today,  Mobility team referral placed, if needs arise please consult dept for reassessment.      If plan is discharge home, recommend the following:     Can travel by private vehicle        Equipment Recommendations None recommended by PT  Recommendations for Other Services       Functional Status Assessment Patient has had a recent decline in their functional status and demonstrates the ability to make significant improvements in function in a reasonable and predictable amount of time.     Precautions / Restrictions Precautions Precautions: Fall;Other (comment) (low fall, air/contact precautions) Recall of Precautions/Restrictions: Intact Restrictions Weight Bearing Restrictions Per Provider Order: No      Mobility  Bed Mobility Overal bed mobility: Independent                  Transfers Overall transfer level: Independent                      Ambulation/Gait Ambulation/Gait assistance: Independent Gait Distance (Feet): 120 Feet Assistive device: None Gait Pattern/deviations: Step-through pattern, Narrow base of support Gait velocity: normal Gait velocity interpretation: <1.31 ft/sec, indicative of household ambulator    General Gait Details: no AD, reciprocal and symmetric pattern, able to complete directional changes  Stairs Stairs: Yes Stairs assistance: Modified independent (Device/Increase time) Stair Management: One rail Left Number of Stairs: 4 General stair comments: reciprocal pattern, able to turn on 4th step. limited progression due to IV pole  Wheelchair Mobility     Tilt Bed    Modified Rankin (Stroke Patients Only)       Balance Overall balance assessment: Independent                                           Pertinent Vitals/Pain Pain Assessment Pain Assessment: No/denies pain    Home Living Family/patient expects to be discharged to:: Private residence Living Arrangements: Spouse/significant other Available Help at Discharge: Family Type of Home: House Home Access: Stairs to enter Entrance Stairs-Rails: Left Entrance Stairs-Number of Steps: 4 Alternate Level Stairs-Number of Steps: 20 Home Layout: Two level;Able to live on main level with bedroom/bathroom (1 room on second floor which he sometimes uses to sleep in) Home Equipment: None      Prior Function Prior Level of Function : Independent/Modified Independent                     Extremity/Trunk Assessment   Upper Extremity Assessment Upper Extremity Assessment: Overall WFL for tasks assessed    Lower Extremity Assessment Lower Extremity Assessment: Overall WFL for tasks assessed  Cervical / Trunk Assessment Cervical / Trunk Assessment: Normal  Communication   Communication Communication: No apparent difficulties    Cognition Arousal: Alert Behavior During Therapy: WFL for tasks assessed/performed   PT - Cognitive impairments: No apparent impairments                         Following commands: Intact       Cueing Cueing Techniques: Verbal cues, Gestural cues     General Comments      Exercises     Assessment/Plan    PT Assessment Patient does  not need any further PT services  PT Problem List         PT Treatment Interventions      PT Goals (Current goals can be found in the Care Plan section)  Acute Rehab PT Goals Patient Stated Goal: return to hiking and working out PT Goal Formulation: With patient Time For Goal Achievement: 09/16/23 Potential to Achieve Goals: Good    Frequency       Co-evaluation               AM-PAC PT "6 Clicks" Mobility  Outcome Measure Help needed turning from your back to your side while in a flat bed without using bedrails?: None Help needed moving from lying on your back to sitting on the side of a flat bed without using bedrails?: None Help needed moving to and from a bed to a chair (including a wheelchair)?: None Help needed standing up from a chair using your arms (e.g., wheelchair or bedside chair)?: None Help needed to walk in hospital room?: None Help needed climbing 3-5 steps with a railing? : None 6 Click Score: 24    End of Session Equipment Utilized During Treatment: Gait belt Activity Tolerance: Patient tolerated treatment well Patient left: in bed;with family/visitor present Nurse Communication: Mobility status PT Visit Diagnosis: Difficulty in walking, not elsewhere classified (R26.2)    Time: 1610-9604 PT Time Calculation (min) (ACUTE ONLY): 23 min   Charges:   PT Evaluation $PT Eval Low Complexity: 1 Low PT Treatments $Gait Training: 8-22 mins PT General Charges $$ ACUTE PT VISIT: 1 Visit         Darien Eden PT Acute Rehabilitation Services Office: [phone] 09/02/2023   Serafin Dames 09/02/2023, 11:18 AM

## 2023-09-03 DIAGNOSIS — J181 Lobar pneumonia, unspecified organism: Secondary | ICD-10-CM | POA: Diagnosis not present

## 2023-09-03 LAB — BASIC METABOLIC PANEL WITH GFR
Anion gap: 6 (ref 5–15)
BUN: 13 mg/dL (ref 8–23)
CO2: 23 mmol/L (ref 22–32)
Calcium: 8.7 mg/dL — ABNORMAL LOW (ref 8.9–10.3)
Chloride: 99 mmol/L (ref 98–111)
Creatinine, Ser: 0.93 mg/dL (ref 0.61–1.24)
GFR, Estimated: >60 mL/min (ref >60)
Glucose, Bld: 122 mg/dL — ABNORMAL HIGH (ref 70–99)
Potassium: 3.2 mmol/L — ABNORMAL LOW (ref 3.5–5.1)
Sodium: 128 mmol/L — ABNORMAL LOW (ref 135–145)

## 2023-09-03 LAB — CBC
HCT: 35.4 % — ABNORMAL LOW (ref 39.0–52.0)
Hemoglobin: 12.1 g/dL — ABNORMAL LOW (ref 13.0–17.0)
MCH: 32.6 pg (ref 26.0–34.0)
MCHC: 34.2 g/dL (ref 30.0–36.0)
MCV: 95.4 fL (ref 80.0–100.0)
Platelets: 178 10*3/uL (ref 150–400)
RBC: 3.71 MIL/uL — ABNORMAL LOW (ref 4.22–5.81)
RDW: 12.3 % (ref 11.5–15.5)
WBC: 5.8 10*3/uL (ref 4.0–10.5)
nRBC: 0 % (ref 0.0–0.2)

## 2023-09-03 LAB — GLUCOSE, CAPILLARY: Glucose-Capillary: 123 mg/dL — ABNORMAL HIGH (ref 70–99)

## 2023-09-03 LAB — PHOSPHORUS: Phosphorus: 2.2 mg/dL — ABNORMAL LOW (ref 2.5–4.6)

## 2023-09-03 LAB — MAGNESIUM: Magnesium: 2.1 mg/dL (ref 1.7–2.4)

## 2023-09-03 MED ORDER — AMOXICILLIN-POT CLAVULANATE 875-125 MG PO TABS: 1.0000 | ORAL_TABLET | Freq: Two times a day (BID) | ORAL | 0 refills | Status: AC

## 2023-09-03 MED ORDER — AMOXICILLIN-POT CLAVULANATE 875-125 MG PO TABS
1.0000 | ORAL_TABLET | Freq: Two times a day (BID) | ORAL | Status: DC
  Administered 2023-09-03: 1 via ORAL
  Filled 2023-09-03: qty 1

## 2023-09-03 MED ORDER — ALBUTEROL SULFATE HFA 108 (90 BASE) MCG/ACT IN AERS: 2.0000 | INHALATION_SPRAY | Freq: Four times a day (QID) | RESPIRATORY_TRACT | 0 refills | Status: DC | PRN

## 2023-09-03 MED ORDER — PREDNISONE 50 MG PO TABS: 50.0000 mg | ORAL_TABLET | Freq: Every day | ORAL | 0 refills | Status: AC

## 2023-09-03 MED ORDER — SODIUM CHLORIDE 0.9 % IV BOLUS: 500.0000 mL | Freq: Once | INTRAVENOUS | Status: DC

## 2023-09-03 MED ORDER — K PHOS MONO-SOD PHOS DI & MONO 155-852-130 MG PO TABS
500.0000 mg | ORAL_TABLET | Freq: Once | ORAL | Status: AC
  Administered 2023-09-03: 500 mg via ORAL
  Filled 2023-09-03: qty 2

## 2023-09-03 MED ORDER — AZITHROMYCIN 500 MG PO TABS: 500.0000 mg | ORAL_TABLET | Freq: Every day | ORAL | 0 refills | Status: DC

## 2023-09-03 MED ORDER — POTASSIUM CHLORIDE CRYS ER 20 MEQ PO TBCR
40.0000 meq | EXTENDED_RELEASE_TABLET | Freq: Once | ORAL | Status: AC
  Administered 2023-09-03: 40 meq via ORAL
  Filled 2023-09-03: qty 2

## 2023-09-03 MED ORDER — POTASSIUM PHOSPHATES 15 MMOLE/5ML IV SOLN
30.0000 mmol | Freq: Once | INTRAVENOUS | Status: DC
  Filled 2023-09-03: qty 10

## 2023-09-03 NOTE — Progress Notes (Signed)
 Discharge instructions given tot patient and wife, questions asked and answered D Rosevelt Constable RN

## 2023-09-03 NOTE — Discharge Summary (Signed)
 Physician Discharge Summary  Christopher Banks FAO:[id number] DOB: [birth year] DOA: 09/01/2023  PCP: Shelvia Dick, MD  Admit date: 09/01/2023 Discharge date: 09/03/2023  Admitted From: Home Disposition: Home  Recommendations for Outpatient Follow-up:  Follow up with PCP in 1-2 weeks Please obtain BMP/CBC in one week your next doctors visit.  Antibiotics, prednisone  and albuterol prescription has been given Tolerating p.o. without any issues  Discharge Condition: Stable CODE STATUS: Full code Diet recommendation: Regular  Brief/Interim Summary: Brief Narrative:  68 year old with history of OSA on CPAP, prediabetes, HLD, recent COVID diagnosis on 5/20 comes to the hospital with shortness of breath.  Patient states he started having URI type symptoms 5 days prior to admission with slowly worsening.  Also had fevers.  Went to his PCP who prescribed him azithromycin and Paxlovid but has not taken these.  In the ER noted to have sepsis secondary to COVID infection/pneumonia.  Started on azithromycin and Rocephin.  With supportive care, electrolyte repletion, steroids and antibiotic he felt significantly better.  Today he is medically stable for discharge.   Assessment & Plan:  Principal Problem:   Lobar pneumonia (HCC)   Sepsis 2/2 CAP COVID-19 infection Chest x-ray is concerning for left lower lobe pneumonia.  Also mildly elevated procalcitonin level.  This is likely superimposed infection along with COVID-19 - Continue Rocephin/azithromycin.  Add 5 days of prednisone .  Will transition to p.o. antibiotics.  Rescue inhaler has been prescribed. - His breathing has significantly improved today    AKI, solved Baseline creatinine 1.0, admission creatinine 1.47 now resolved with fluids.   Hypovolemic hyponatremia Admission sodium 124, improved with fluid.  Encourage oral hydration at home.  Repeat with PCP in about 1 week   Elevated liver enzyme Mildly elevated AST.  Currently of no  clinical significance, continue to monitor   OSA -resume home CPAP   Prediabetes A1c 5.6% on labs 6 months ago.  Currently continue to monitor   HLD -not on statin therapy in the outpatient setting     DVT prophylaxis: Lovenox    Code Status: Full Code Family Communication: Spouse at bedside Discharge    Subjective: Seen at bedside, feels great today.  Wishing to go home.   Examination:  General exam: Appears calm and comfortable  Respiratory system: Clear to auscultation. Respiratory effort normal. Cardiovascular system: S1 & S2 heard, RRR. No JVD, murmurs, rubs, gallops or clicks. No pedal edema. Gastrointestinal system: Abdomen is nondistended, soft and nontender. No organomegaly or masses felt. Normal bowel sounds heard. Central nervous system: Alert and oriented. No focal neurological deficits. Extremities: Symmetric 5 x 5 power. Skin: No rashes, lesions or ulcers Psychiatry: Judgement and insight appear normal. Mood & affect appropriate.    Discharge Diagnoses:  Principal Problem:   Lobar pneumonia [facility])      Discharge Exam: Vitals:   09/03/23 0805 09/03/23 0809  BP:    Pulse:    Resp:    Temp:    SpO2: 98% 99%   Vitals:   09/02/23 2044 09/03/23 0500 09/03/23 0805 09/03/23 0809  BP: 125/79 (!) 131/49    Pulse: (!) 48 65    Resp: 20 20    Temp: 98.1 F (36.7 C) 97.9 F (36.6 C)    TempSrc: Oral Oral    SpO2: 93% 93% 98% 99%  Weight:      Height:          Discharge Instructions   Allergies as of 09/03/2023       Reactions  Atorvastatin     Indigestion, trouble sleeping   Tamsulosin Other (See Comments)   lightheadedness        Medication List     STOP taking these medications    azithromycin 250 MG tablet Commonly known as: ZITHROMAX   nirmatrelvir/ritonavir 20 x 150 MG & 10 x 100MG  Tabs Commonly known as: PAXLOVID       TAKE these medications    albuterol 108 (90 Base) MCG/ACT inhaler Commonly known as: VENTOLIN  HFA Inhale 2 puffs into the lungs every 6 (six) hours as needed for wheezing or shortness of breath.   amoxicillin-clavulanate 875-125 MG tablet Commonly known as: AUGMENTIN Take 1 tablet by mouth every 12 (twelve) hours for 5 days.   ferrous sulfate 325 (65 FE) MG EC tablet Take 325 mg by mouth daily.   fluticasone 50 MCG/ACT nasal spray Commonly known as: FLONASE INSTILL 2 SPRAYS INTO EACH NOSTRIL EVERY DAY   multivitamin tablet Take 1 tablet by mouth daily.   predniSONE  50 MG tablet Commonly known as: DELTASONE  Take 1 tablet (50 mg total) by mouth daily with breakfast for 4 days.   Vitamin D3 50 MCG (2000 UT) capsule Take 2,000 Units by mouth daily.        Allergies  Allergen Reactions   Atorvastatin      Indigestion, trouble sleeping   Tamsulosin Other (See Comments)    lightheadedness    You were cared for by a hospitalist during your hospital stay. If you have any questions about your discharge medications or the care you received while you were in the hospital after you are discharged, you can call the unit and asked to speak with the hospitalist on call if the hospitalist that took care of you is not available. Once you are discharged, your primary care physician will handle any further medical issues. Please note that no refills for any discharge medications will be authorized once you are discharged, as it is imperative that you return to your primary care physician (or establish a relationship with a primary care physician if you do not have one) for your aftercare needs so that they can reassess your need for medications and monitor your lab values.  You were cared for by a hospitalist during your hospital stay. If you have any questions about your discharge medications or the care you received while you were in the hospital after you are discharged, you can call the unit and asked to speak with the hospitalist on call if the hospitalist that took care of you is not  available. Once you are discharged, your primary care physician will handle any further medical issues. Please note that NO REFILLS for any discharge medications will be authorized once you are discharged, as it is imperative that you return to your primary care physician (or establish a relationship with a primary care physician if you do not have one) for your aftercare needs so that they can reassess your need for medications and monitor your lab values.  Please request your Prim.MD to go over all Hospital Tests and Procedure/Radiological results at the follow up, please get all Hospital records sent to your Prim MD by signing hospital release before you go home.  Get CBC, CMP, 2 view Chest X ray checked  by Primary MD during your next visit or SNF MD in 5-7 days ( we routinely change or add medications that can affect your baseline labs and fluid status, therefore we recommend that you get the mentioned basic workup  next visit with your PCP, your PCP may decide not to get them or add new tests based on their clinical decision)  On your next visit with your primary care physician please Get Medicines reviewed and adjusted.  If you experience worsening of your admission symptoms, develop shortness of breath, life threatening emergency, suicidal or homicidal thoughts you must seek medical attention immediately by calling 911 or calling your MD immediately  if symptoms less severe.  You Must read complete instructions/literature along with all the possible adverse reactions/side effects for all the Medicines you take and that have been prescribed to you. Take any new Medicines after you have completely understood and accpet all the possible adverse reactions/side effects.   Do not drive, operate heavy machinery, perform activities at heights, swimming or participation in water activities or provide baby sitting services if your were admitted for syncope or siezures until you have seen by Primary MD or a  Neurologist and advised to do so again.  Do not drive when taking Pain medications.   Procedures/Studies: DG Chest 2 View Result Date: 09/01/2023 CLINICAL DATA:  Sepsis. EXAM: CHEST - 2 VIEW COMPARISON:  X-ray 08/31/2023 FINDINGS: Significant increase in parenchymal opacity identified in the right mid to lower lung, lower lobe. No pneumothorax, effusion or edema. Normal cardiopericardial silhouette. Degenerative changes along the spine and shoulders. IMPRESSION: Significant interval increase in parenchymal opacities along the right mid lower lung particularly the right lower lobe. Infiltrate or pneumonia. Recommend follow-up. Electronically Signed   By: Adrianna Horde M.D.   On: 09/01/2023 12:14   DG Chest 2 View Result Date: 08/31/2023 CLINICAL DATA:  Cough and fever EXAM: CHEST - 2 VIEW COMPARISON:  X-ray 04/27/2019 FINDINGS: No pneumothorax, effusion or edema. Normal cardiopericardial silhouette. Subtle opacity in the right lower lobe. A subtle infiltrates possible. Recommend follow-up. Degenerative changes of the spine and shoulders. IMPRESSION: Subtle opacity in the right lower lobe. Subtle infiltrate is possible. Recommend follow-up. Electronically Signed   By: Adrianna Horde M.D.   On: 08/31/2023 12:54     The results of significant diagnostics from this hospitalization (including imaging, microbiology, ancillary and laboratory) are listed below for reference.     Microbiology: Recent Results (from the past 240 hours)  Blood Culture (routine x 2)     Status: None (Preliminary result)   Collection Time: 09/01/23  9:45 AM   Specimen: BLOOD  Result Value Ref Range Status   Specimen Description   Final    BLOOD RIGHT ANTECUBITAL Performed at [facility], [address] [place], [place] [address]    Special Requests   Final    BOTTLES DRAWN AEROBIC AND ANAEROBIC Blood Culture adequate volume Performed at [facility], [address],  [place], [place] [address]    Culture   Final    NO GROWTH 2 DAYS Performed at [facility], [address] [place], [place] [address]    Report Status PENDING  Incomplete  Resp panel by RT-PCR (RSV, Flu A&B, Covid) Anterior Nasal Swab     Status: None   Collection Time: 09/01/23  9:50 AM   Specimen: Anterior Nasal Swab  Result Value Ref Range Status   SARS Coronavirus 2 by RT PCR NEGATIVE NEGATIVE Final    Comment: (NOTE) SARS-CoV-2 target nucleic acids are NOT DETECTED.  The SARS-CoV-2 RNA is generally detectable in upper respiratory specimens during the acute phase of infection. The lowest concentration of SARS-CoV-2 viral copies this assay can detect is 138  copies/mL. A negative result does not preclude SARS-Cov-2 infection and should not be used as the sole basis for treatment or other patient management decisions. A negative result may occur with  improper specimen collection/handling, submission of specimen other than nasopharyngeal swab, presence of viral mutation(s) within the areas targeted by this assay, and inadequate number of viral copies(<138 copies/mL). A negative result must be combined with clinical observations, patient history, and epidemiological information. The expected result is Negative.  Fact Sheet for Patients:  BloggerCourse.com  Fact Sheet for Healthcare Providers:  SeriousBroker.it  This test is no t yet approved or cleared by the [place]  FDA and  has been authorized for detection and/or diagnosis of SARS-CoV-2 by FDA under an Emergency Use Authorization (EUA). This EUA will remain  in effect (meaning this test can be used) for the duration of the COVID-19 declaration under Section 564(b)(1) of the Act, 21 U.S.C.section 360bbb-3(b)(1), unless the authorization is terminated  or revoked sooner.       Influenza A by PCR NEGATIVE NEGATIVE Final   Influenza B by PCR NEGATIVE NEGATIVE Final     Comment: (NOTE) The Xpert Xpress SARS-CoV-2/FLU/RSV plus assay is intended as an aid in the diagnosis of influenza from Nasopharyngeal swab specimens and should not be used as a sole basis for treatment. Nasal washings and aspirates are unacceptable for Xpert Xpress SARS-CoV-2/FLU/RSV testing.  Fact Sheet for Patients: BloggerCourse.com  Fact Sheet for Healthcare Providers: SeriousBroker.it  This test is not yet approved or cleared by the [place]  FDA and has been authorized for detection and/or diagnosis of SARS-CoV-2 by FDA under an Emergency Use Authorization (EUA). This EUA will remain in effect (meaning this test can be used) for the duration of the COVID-19 declaration under Section 564(b)(1) of the Act, 21 U.S.C. section 360bbb-3(b)(1), unless the authorization is terminated or revoked.     Resp Syncytial Virus by PCR NEGATIVE NEGATIVE Final    Comment: (NOTE) Fact Sheet for Patients: BloggerCourse.com  Fact Sheet for Healthcare Providers: SeriousBroker.it  This test is not yet approved or cleared by the [place]  FDA and has been authorized for detection and/or diagnosis of SARS-CoV-2 by FDA under an Emergency Use Authorization (EUA). This EUA will remain in effect (meaning this test can be used) for the duration of the COVID-19 declaration under Section 564(b)(1) of the Act, 21 U.S.C. section 360bbb-3(b)(1), unless the authorization is terminated or revoked.  Performed at [facility], [address], [place], [place] [address]   Blood Culture (routine x 2)     Status: None (Preliminary result)   Collection Time: 09/01/23  9:50 AM   Specimen: BLOOD  Result Value Ref Range Status   Specimen Description   Final    BLOOD LEFT ANTECUBITAL Performed at [facility], [address] [place] [address] [place], [place] [address]     Special Requests   Final    BOTTLES DRAWN AEROBIC AND ANAEROBIC Blood Culture adequate volume Performed at [facility], [address] [place], [place] [address]    Culture   Final    NO GROWTH 2 DAYS Performed at [facility], [address], [place], [place] [address]    Report Status PENDING  Incomplete     Labs: BNP (last 3 results) Recent Labs    09/02/23 0850  BNP 474.6*   Basic Metabolic Panel: Recent Labs  Lab 09/01/23 0943 09/02/23 0817 09/03/23 0508  NA 124* 127* 128*  K 3.8 3.5 3.2*  CL 90*  99 99  CO2 24 19* 23  GLUCOSE 158* 113* 122*  BUN 21 14 13   CREATININE 1.47* 0.97 0.93  CALCIUM  9.3 8.4* 8.7*  MG  --   --  2.1  PHOS  --   --  2.2*   Liver Function Tests: Recent Labs  Lab 09/01/23 0943 09/02/23 0817  AST 58* 32  ALT 33 23  ALKPHOS 82 48  BILITOT 1.2 1.3*  PROT 7.2 6.5  ALBUMIN 3.9 3.0*   No results for input(s): "LIPASE", "AMYLASE" in the last 168 hours. No results for input(s): "AMMONIA" in the last 168 hours. CBC: Recent Labs  Lab 09/01/23 0943 09/02/23 0817 09/03/23 0508  WBC 5.5 6.3 5.8  NEUTROABS 4.3  --   --   HGB 13.9 12.6* 12.1*  HCT 40.4 38.2* 35.4*  MCV 94.4 99.0 95.4  PLT 153 148* 178   Cardiac Enzymes: No results for input(s): "CKTOTAL", "CKMB", "CKMBINDEX", "TROPONINI" in the last 168 hours. BNP: Invalid input(s): "POCBNP" CBG: Recent Labs  Lab 09/02/23 0738 09/02/23 1139 09/02/23 1624 09/03/23 0734  GLUCAP 124* 157* 180* 123*   D-Dimer No results for input(s): "DDIMER" in the last 72 hours. Hgb A1c No results for input(s): "HGBA1C" in the last 72 hours. Lipid Profile No results for input(s): "CHOL", "HDL", "LDLCALC", "TRIG", "CHOLHDL", "LDLDIRECT" in the last 72 hours. Thyroid  function studies No results for input(s): "TSH", "T4TOTAL", "T3FREE", "THYROIDAB" in the last 72 hours.  Invalid input(s): "FREET3" Anemia work up No results for input(s): "VITAMINB12", "FOLATE",  "FERRITIN", "TIBC", "IRON", "RETICCTPCT" in the last 72 hours. Urinalysis    Component Value Date/Time   COLORURINE YELLOW 09/01/2023 0943   APPEARANCEUR CLEAR 09/01/2023 0943   LABSPEC 1.026 09/01/2023 0943   PHURINE 6.0 09/01/2023 0943   GLUCOSEU NEGATIVE 09/01/2023 0943   HGBUR TRACE (A) 09/01/2023 0943   BILIRUBINUR NEGATIVE 09/01/2023 0943   KETONESUR 15 (A) 09/01/2023 0943   PROTEINUR 100 (A) 09/01/2023 0943   NITRITE NEGATIVE 09/01/2023 0943   LEUKOCYTESUR NEGATIVE 09/01/2023 0943   Sepsis Labs Recent Labs  Lab 09/01/23 0943 09/02/23 0817 09/03/23 0508  WBC 5.5 6.3 5.8   Microbiology Recent Results (from the past 240 hours)  Blood Culture (routine x 2)     Status: None (Preliminary result)   Collection Time: 09/01/23  9:45 AM   Specimen: BLOOD  Result Value Ref Range Status   Specimen Description   Final    BLOOD RIGHT ANTECUBITAL Performed at [facility], [address], [place], [place] [address]    Special Requests   Final    BOTTLES DRAWN AEROBIC AND ANAEROBIC Blood Culture adequate volume Performed at [facility], [address] [place], [place] [address]    Culture   Final    NO GROWTH 2 DAYS Performed at [facility], [address], [place], [place] [address]    Report Status PENDING  Incomplete  Resp panel by RT-PCR (RSV, Flu A&B, Covid) Anterior Nasal Swab     Status: None   Collection Time: 09/01/23  9:50 AM   Specimen: Anterior Nasal Swab  Result Value Ref Range Status   SARS Coronavirus 2 by RT PCR NEGATIVE NEGATIVE Final    Comment: (NOTE) SARS-CoV-2 target nucleic acids are NOT DETECTED.  The SARS-CoV-2 RNA is generally detectable in upper respiratory specimens during the acute phase of infection. The lowest concentration of SARS-CoV-2 viral copies this assay can detect is 138 copies/mL. A negative result does not preclude SARS-Cov-2 infection  and should not be used as the sole basis for  treatment or other patient management decisions. A negative result may occur with  improper specimen collection/handling, submission of specimen other than nasopharyngeal swab, presence of viral mutation(s) within the areas targeted by this assay, and inadequate number of viral copies(<138 copies/mL). A negative result must be combined with clinical observations, patient history, and epidemiological information. The expected result is Negative.  Fact Sheet for Patients:  BloggerCourse.com  Fact Sheet for Healthcare Providers:  SeriousBroker.it  This test is no t yet approved or cleared by the [place]  FDA and  has been authorized for detection and/or diagnosis of SARS-CoV-2 by FDA under an Emergency Use Authorization (EUA). This EUA will remain  in effect (meaning this test can be used) for the duration of the COVID-19 declaration under Section 564(b)(1) of the Act, 21 U.S.C.section 360bbb-3(b)(1), unless the authorization is terminated  or revoked sooner.       Influenza A by PCR NEGATIVE NEGATIVE Final   Influenza B by PCR NEGATIVE NEGATIVE Final    Comment: (NOTE) The Xpert Xpress SARS-CoV-2/FLU/RSV plus assay is intended as an aid in the diagnosis of influenza from Nasopharyngeal swab specimens and should not be used as a sole basis for treatment. Nasal washings and aspirates are unacceptable for Xpert Xpress SARS-CoV-2/FLU/RSV testing.  Fact Sheet for Patients: BloggerCourse.com  Fact Sheet for Healthcare Providers: SeriousBroker.it  This test is not yet approved or cleared by the [place]  FDA and has been authorized for detection and/or diagnosis of SARS-CoV-2 by FDA under an Emergency Use Authorization (EUA). This EUA will remain in effect (meaning this test can be used) for the duration of the COVID-19 declaration under Section 564(b)(1) of the Act, 21  U.S.C. section 360bbb-3(b)(1), unless the authorization is terminated or revoked.     Resp Syncytial Virus by PCR NEGATIVE NEGATIVE Final    Comment: (NOTE) Fact Sheet for Patients: BloggerCourse.com  Fact Sheet for Healthcare Providers: SeriousBroker.it  This test is not yet approved or cleared by the [place]  FDA and has been authorized for detection and/or diagnosis of SARS-CoV-2 by FDA under an Emergency Use Authorization (EUA). This EUA will remain in effect (meaning this test can be used) for the duration of the COVID-19 declaration under Section 564(b)(1) of the Act, 21 U.S.C. section 360bbb-3(b)(1), unless the authorization is terminated or revoked.  Performed at [facility], [address], [place], [place] [address]   Blood Culture (routine x 2)     Status: None (Preliminary result)   Collection Time: 09/01/23  9:50 AM   Specimen: BLOOD  Result Value Ref Range Status   Specimen Description   Final    BLOOD LEFT ANTECUBITAL Performed at [facility], [address] [place], [place] [address]    Special Requests   Final    BOTTLES DRAWN AEROBIC AND ANAEROBIC Blood Culture adequate volume Performed at [facility], [address] [place], [place] [address]    Culture   Final    NO GROWTH 2 DAYS Performed at [facility], [address] [place], [place] [address]    Report Status PENDING  Incomplete     Time coordinating discharge:  I have spent 35 minutes face to face with the patient and on the ward discussing the patients care, assessment, plan and disposition with other care givers. >50% of the time was devoted counseling the patient about the risks and benefits of treatment/Discharge disposition and coordinating care.  SIGNED:   Maggie Schooner, MD  [facility] 09/03/2023, 11:01 AM   If 7PM-7AM, please contact  night-coverage

## 2023-09-06 LAB — CULTURE, BLOOD (ROUTINE X 2)
Culture: NO GROWTH
Culture: NO GROWTH
Special Requests: ADEQUATE
Special Requests: ADEQUATE

## 2023-09-07 ENCOUNTER — Telehealth: Payer: Self-pay | Admitting: *Deleted

## 2023-09-07 NOTE — Transitions of Care (Post Inpatient/ED Visit) (Signed)
   09/07/2023  Name: Christopher Banks MRN: [id number] DOB: Mar 06, 1956  Today's TOC FU Call Status: Today's TOC FU Call Status:: Unsuccessful Call (1st Attempt) Unsuccessful Call (1st Attempt) Date: 09/07/23  Attempted to reach the patient regarding the most recent Inpatient/ED visit.  Follow Up Plan: Additional outreach attempts will be made to reach the patient to complete the Transitions of Care (Post Inpatient/ED visit) call.   Una Ganser BSN RN [facility] Health Care Management Coordinator [email or web address] Direct Dial: [phone]  Fax: [phone] Website: [email or web address]

## 2023-09-07 NOTE — Transitions of Care (Post Inpatient/ED Visit) (Signed)
 09/07/2023  Name: Christopher Banks MRN: [id number] DOB: [birth year]  Today's TOC FU Call Status: Today's TOC FU Call Status:: Successful TOC FU Call Completed TOC FU Call Complete Date: 09/07/23 Patient's Name and Date of Birth confirmed.  Transition Care Management Follow-up Telephone Call Date of Discharge: 09/03/23 Discharge Facility: [facility]) Type of Discharge: Inpatient Admission Primary Inpatient Discharge Diagnosis:: Lobar pneumonia How have you been since you were released from the hospital?: Better Any questions or concerns?: Yes Patient Questions/Concerns:: Patient stated he had a few labs that was abnormal. RN discussed labs and he will follow up further with PCP Patient Questions/Concerns Addressed: Other: (Patient will discuss labs further with PCP and asked about recommened labs to be drawn per hospitalist)  Items Reviewed: Did you receive and understand the discharge instructions provided?: Yes Medications obtained,verified, and reconciled?: Yes (Medications Reviewed) Any new allergies since your discharge?: No Dietary orders reviewed?: No Do you have support at home?: Yes People in Home [RPT]: spouse Name of Support/Comfort Primary Source: Devra Fontana  Medications Reviewed Today: Medications Reviewed Today     Reviewed by Eilene Grater, RN (Case Manager) on 09/07/23 at 1608  Med List Status: <None>   Medication Order Taking? Sig Documenting Provider Last Dose Status Informant  albuterol (VENTOLIN HFA) 108 (90 Base) MCG/ACT inhaler [id number] Yes Inhale 2 puffs into the lungs every 6 (six) hours as needed for wheezing or shortness of breath. Maggie Schooner, MD Taking Active   amoxicillin-clavulanate (AUGMENTIN) 875-125 MG tablet [id number] Yes Take 1 tablet by mouth every 12 (twelve) hours for 5 days. Maggie Schooner, MD Taking Active   azithromycin (ZITHROMAX) 500 MG tablet [id number] Yes Take 500 mg by mouth daily. [provider] Taking Active    Cholecalciferol (VITAMIN D3) 50 MCG (2000 UT) capsule [id number] Yes Take 2,000 Units by mouth daily. [provider] Taking Active Self  ferrous sulfate 325 (65 FE) MG EC tablet [id number] Yes Take 325 mg by mouth daily. [provider] Taking Active Self  fluticasone (FLONASE) 50 MCG/ACT nasal spray [id number] Yes INSTILL 2 SPRAYS INTO EACH NOSTRIL EVERY DAY McGowen, Minetta Aly, MD Taking Active Self  Multiple Vitamin (MULTIVITAMIN) tablet [id number] Yes Take 1 tablet by mouth daily. [provider] Taking Active Self  predniSONE  (DELTASONE ) 50 MG tablet [id number] Yes Take 1 tablet (50 mg total) by mouth daily with breakfast for 4 days. Maggie Schooner, MD Taking Active             Home Care and Equipment/Supplies: Were Home Health Services Ordered?: NA Any new equipment or medical supplies ordered?: NA  Functional Questionnaire: Do you need assistance with bathing/showering or dressing?: No Do you need assistance with meal preparation?: No Do you need assistance with eating?: No Do you have difficulty maintaining continence: No Do you need assistance with getting out of bed/getting out of a chair/moving?: No Do you have difficulty managing or taking your medications?: No  Follow up appointments reviewed: PCP Follow-up appointment confirmed?: No MD Provider Line [phone] Given: Yes (Patient will call and make follow up appt) Specialist Hospital Follow-up appointment confirmed?: NA Do you need transportation to your follow-up appointment?: No Do you understand care options if your condition(s) worsen?: Yes-patient verbalized understanding  SDOH Interventions Today    Flowsheet Row Most Recent Value  SDOH Interventions   Food Insecurity Interventions Intervention Not Indicated  Housing Interventions Intervention Not Indicated  Transportation Interventions Intervention Not Indicated  Utilities Interventions Intervention Not Indicated  Una Ganser BSN RN [facility] Health Care Management Coordinator [email or web address] Direct Dial: [phone]  Fax: [phone] Website: [email or web address]

## 2023-09-14 ENCOUNTER — Ambulatory Visit: Admitting: Family Medicine

## 2023-09-14 ENCOUNTER — Encounter: Payer: Self-pay | Admitting: Family Medicine

## 2023-09-14 VITALS — BP 100/56 | HR 73 | Ht 70.0 in | Wt 172.2 lb

## 2023-09-14 DIAGNOSIS — E871 Hypo-osmolality and hyponatremia: Secondary | ICD-10-CM | POA: Diagnosis not present

## 2023-09-14 DIAGNOSIS — J181 Lobar pneumonia, unspecified organism: Secondary | ICD-10-CM | POA: Diagnosis not present

## 2023-09-14 DIAGNOSIS — A6000 Herpesviral infection of urogenital system, unspecified: Secondary | ICD-10-CM

## 2023-09-14 DIAGNOSIS — Z8616 Personal history of COVID-19: Secondary | ICD-10-CM

## 2023-09-14 DIAGNOSIS — R7401 Elevation of levels of liver transaminase levels: Secondary | ICD-10-CM | POA: Diagnosis not present

## 2023-09-14 LAB — PHOSPHORUS: Phosphorus: 3.5 mg/dL (ref 2.3–4.6)

## 2023-09-14 MED ORDER — VALACYCLOVIR HCL 500 MG PO TABS: 500.0000 mg | ORAL_TABLET | Freq: Every day | ORAL | 3 refills | Status: AC

## 2023-09-14 NOTE — Progress Notes (Signed)
 09/14/2023  CC:  Chief Complaint  Patient presents with   Hospitalization Follow-up    Pt was admitted 5/21-5/23 for covid and pneumonia; recommended repeat labs (BMP & CBC)    Patient is a 68 y.o. male who presents for  hospital follow up, specifically Transitional Care Services face-to-face visit. Dates hospitalized: 5/21 to 09/03/2023. Days since d/c from hospital: 11 days Patient was discharged from hospital to home. Reason for admission to hospital: Sepsis secondary to COVID infection and lobar pneumonia. Date of interactive (phone) contact with patient and/or caregiver: 09/07/2023.  I have reviewed patient's discharge summary plus pertinent specific notes, labs, and imaging from the hospitalization.  Not hypoxic.  Rocephin , Paxlovid , and Zithromax  in the hospital.  IV fluids. Significantly hyponatremic upon admission but this improved with IV fluids.  Potassium, calcium , and phosphorus were mildly low as well Slight cough still.  Still not quite back in full energy level.  However, he did go back to the gym yesterday and did some light resistance training. Denies fever, shortness of breath, chest pain, dizziness, or wheezing.  He reports a history of recurrent herpes genitalis. Says he has approximately 2-3 episodes per year that are mild and of short duration. He is interested in something that may help both prevent the outbreaks as well as prevent transmission.  Medication reconciliation was done today and patient is taking meds as recommended by discharging hospitalist/specialist.   Discharge medication list: Albuterol  2 puffs every 6 hours, Augmentin  875 twice daily for 5 days, ferrous sulfate  325 mg a day, Flonase  daily, multivitamin daily, prednisone  50 mg a day for 4 days, and vitamin D  2000 units daily.  PMH:  Past Medical History:  Diagnosis Date   Allergic rhinitis    dust mites   BPH with obstruction/lower urinary tract symptoms    BPPV (benign paroxysmal positional  vertigo)    COVID-19 virus infection 03/2019   prolonged post-covid DOE and HR variability.     Elevated PSA    Prostate bx 09/2018 NORMAL. PSA dec to 7.16 Apr 2019, then 5.14 October 2019.   Elevated TSH    2018, not taking biotin. 2020, same-->T4/T3 normal.   Herpes genitalis    Recurrent   History of adenomatous polyp of colon    Hyperlipidemia    statin started 2022/2023   OSA (obstructive sleep apnea) 08/2019   sleep study->severe OSA->CPAP recommended   Pneumonia    covid + lobar, hosp 08/2023   Prediabetes    10/2021 fasting glucose 116, hemoglobin A1c 5.7%   Pulmonary nodule Jan/feb 2021   5 mm R lung; pt low risk for lung ca so NO F/U IMAGING IS INDICATED.    PSH:  Past Surgical History:  Procedure Laterality Date   CARDIOVASCULAR STRESS TEST  12/02/2021   Myocardial perfusion imaging normal, EF normal.   COLONOSCOPY  2012   2012 Normal.  10/2021 adenoma x 1->recall 5 yrs   History of pneumonia     polysomnogram  08/25/2019   severe OSA->CPAP recommended   PROSTATE BIOPSY  09/2018   NORMAL   SHOULDER SURGERY Right 09/29/2022   RC repair   TRANSTHORACIC ECHOCARDIOGRAM     12/23/21 EF 60 to 65%.  Grade 1 diastolic dysfunction.   ZIO patch  01/26/2020   NORMAL    MEDS:  Outpatient Medications Prior to Visit  Medication Sig Dispense Refill   Cholecalciferol  (VITAMIN D3) 50 MCG (2000 UT) capsule Take 2,000 Units by mouth daily.     ferrous sulfate   325 (65 FE) MG EC tablet Take 325 mg by mouth daily.     fluticasone  (FLONASE ) 50 MCG/ACT nasal spray INSTILL 2 SPRAYS INTO EACH NOSTRIL EVERY DAY 16 g 1   Multiple Vitamin (MULTIVITAMIN) tablet Take 1 tablet by mouth daily.     albuterol  (VENTOLIN  HFA) 108 (90 Base) MCG/ACT inhaler Inhale 2 puffs into the lungs every 6 (six) hours as needed for wheezing or shortness of breath. (Patient not taking: Reported on 09/14/2023) 8 g 0   azithromycin  (ZITHROMAX ) 500 MG tablet Take 500 mg by mouth daily. (Patient not taking: Reported on  09/14/2023)     Facility-Administered Medications Prior to Visit  Medication Dose Route Frequency Provider Last Rate Last Admin   regadenoson  (LEXISCAN ) injection SOLN 0.4 mg  0.4 mg Intravenous Once Lenise Quince, MD        Physical Exam    09/14/2023   11:10 AM 09/03/2023    5:00 AM 09/02/2023    8:44 PM  Vitals with BMI  Height 5\' 10"     Weight 172 lbs 3 oz    BMI 24.71    Systolic 100 131 161  Diastolic 56 49 79  Pulse 73 65 48   Gen: Alert, well appearing.  Patient is oriented to person, place, time, and situation. AFFECT: pleasant, lucid thought and speech. CV: RRR, no m/r/g.   LUNGS: CTA bilat, nonlabored resps, good aeration in all lung fields. No edema   Pertinent labs/imaging Last CBC Lab Results  Component Value Date   WBC 5.8 09/03/2023   HGB 12.1 (L) 09/03/2023   HCT 35.4 (L) 09/03/2023   MCV 95.4 09/03/2023   MCH 32.6 09/03/2023   RDW 12.3 09/03/2023   PLT 178 09/03/2023   Lab Results  Component Value Date   IRON 100 10/31/2020   TIBC 346 10/31/2020   FERRITIN 110 10/31/2020   Lab Results  Component Value Date   IRON 100 10/31/2020   TIBC 346 10/31/2020   FERRITIN 110 10/31/2020   Lab Results  Component Value Date   VITAMINB12 746 06/24/2016   Last metabolic panel Lab Results  Component Value Date   GLUCOSE 122 (H) 09/03/2023   NA 128 (L) 09/03/2023   K 3.2 (L) 09/03/2023   CL 99 09/03/2023   CO2 23 09/03/2023   BUN 13 09/03/2023   CREATININE 0.93 09/03/2023   GFRNONAA >60 09/03/2023   CALCIUM  8.7 (L) 09/03/2023   PHOS 2.2 (L) 09/03/2023   PROT 6.5 09/02/2023   ALBUMIN 3.0 (L) 09/02/2023   BILITOT 1.3 (H) 09/02/2023   ALKPHOS 48 09/02/2023   AST 32 09/02/2023   ALT 23 09/02/2023   ANIONGAP 6 09/03/2023   ASSESSMENT/PLAN:  #1 COVID infection with superimposed right lower lobe pneumonia. He is nearly completely over this illness now. He needs f/u cxr approx 10/01/23--> ordered. Check CBC and complete metabolic panel  today.  2.  Recurrent herpes genitalis. Start Valtrex 500 mg a day for suppression to limit recurrences and shedding.  Medical decision making of moderate complexity was utilized today.  FOLLOW UP: October/November for CPE  Signed:  Arletha Lady, MD           09/14/2023

## 2023-09-15 ENCOUNTER — Ambulatory Visit: Payer: Self-pay | Admitting: Family Medicine

## 2023-09-15 DIAGNOSIS — D539 Nutritional anemia, unspecified: Secondary | ICD-10-CM

## 2023-09-15 DIAGNOSIS — D75839 Thrombocytosis, unspecified: Secondary | ICD-10-CM

## 2023-09-15 LAB — CBC WITH DIFFERENTIAL/PLATELET
Absolute Lymphocytes: 1938 {cells}/uL (ref 850–3900)
Absolute Monocytes: 454 {cells}/uL (ref 200–950)
Basophils Absolute: 39 {cells}/uL (ref 0–200)
Basophils Relative: 0.7 %
Eosinophils Absolute: 78 {cells}/uL (ref 15–500)
Eosinophils Relative: 1.4 %
HCT: 38.9 % (ref 38.5–50.0)
Hemoglobin: 12.7 g/dL — ABNORMAL LOW (ref 13.2–17.1)
MCH: 33.1 pg — ABNORMAL HIGH (ref 27.0–33.0)
MCHC: 32.6 g/dL (ref 32.0–36.0)
MCV: 101.3 fL — ABNORMAL HIGH (ref 80.0–100.0)
MPV: 8.3 fL (ref 7.5–12.5)
Monocytes Relative: 8.1 %
Neutro Abs: 3091 {cells}/uL (ref 1500–7800)
Neutrophils Relative %: 55.2 %
Platelets: 464 10*3/uL — ABNORMAL HIGH (ref 140–400)
RBC: 3.84 10*6/uL — ABNORMAL LOW (ref 4.20–5.80)
RDW: 13.1 % (ref 11.0–15.0)
Total Lymphocyte: 34.6 %
WBC: 5.6 10*3/uL (ref 3.8–10.8)

## 2023-09-15 LAB — COMPREHENSIVE METABOLIC PANEL WITH GFR
AG Ratio: 1.2 (calc) (ref 1.0–2.5)
ALT: 29 U/L (ref 9–46)
AST: 28 U/L (ref 10–35)
Albumin: 3.8 g/dL (ref 3.6–5.1)
Alkaline phosphatase (APISO): 56 U/L (ref 35–144)
BUN: 16 mg/dL (ref 7–25)
CO2: 26 mmol/L (ref 20–32)
Calcium: 9.2 mg/dL (ref 8.6–10.3)
Chloride: 102 mmol/L (ref 98–110)
Creat: 0.93 mg/dL (ref 0.70–1.35)
Globulin: 3.1 g/dL (ref 1.9–3.7)
Glucose, Bld: 102 mg/dL — ABNORMAL HIGH (ref 65–99)
Potassium: 4.4 mmol/L (ref 3.5–5.3)
Sodium: 136 mmol/L (ref 135–146)
Total Bilirubin: 0.6 mg/dL (ref 0.2–1.2)
Total Protein: 6.9 g/dL (ref 6.1–8.1)
eGFR: 89 mL/min/{1.73_m2} (ref >=60)

## 2023-10-11 ENCOUNTER — Ambulatory Visit (HOSPITAL_BASED_OUTPATIENT_CLINIC_OR_DEPARTMENT_OTHER)
Admission: RE | Admit: 2023-10-11 | Discharge: 2023-10-11 | Disposition: A | Discharge status: Home / Self Care | Source: Ambulatory Visit | Attending: Family Medicine | Admitting: Family Medicine

## 2023-10-11 DIAGNOSIS — J181 Lobar pneumonia, unspecified organism: Secondary | ICD-10-CM | POA: Insufficient documentation

## 2023-12-29 ENCOUNTER — Ambulatory Visit (INDEPENDENT_AMBULATORY_CARE_PROVIDER_SITE_OTHER): Admitting: *Deleted

## 2023-12-29 DIAGNOSIS — Z0000 Encounter for general adult medical examination without abnormal findings: Secondary | ICD-10-CM

## 2023-12-29 NOTE — Progress Notes (Signed)
 Subjective:   Christopher Banks is a 68 y.o. male who presents for Medicare Annual/Subsequent preventive examination.  Visit Complete: Virtual I connected with  Christopher Banks on 12/29/23 by a audio enabled telemedicine application and verified that I am speaking with the correct person using two identifiers.  Patient Location: Home  Provider Location: Home Office  I discussed the limitations of evaluation and management by telemedicine. The patient expressed understanding and agreed to proceed.  Vital Signs: Because this visit was a virtual/telehealth visit, some criteria may be missing or patient reported. Any vitals not documented were not able to be obtained and vitals that have been documented are patient reported. .  Cardiac Risk Factors include: advanced age (>70men, >30 women);male gender;obesity (BMI >30kg/m2)     Objective:    There were no vitals filed for this visit. There is no height or weight on file to calculate BMI.     12/29/2023    4:09 PM 09/01/2023    6:56 PM 09/01/2023    9:29 AM 11/18/2022   11:17 AM  Advanced Directives  Does Patient Have a Medical Advance Directive? Yes  No Yes  Type of Estate agent of [employer] Power of [place];Living will  Copy of Healthcare Power of Attorney in Chart? No - copy requested   No - copy requested  Would patient like information on creating a medical advance directive?  No - Patient declined      Current Medications (verified) Outpatient Encounter Medications as of 12/29/2023  Medication Sig   Cholecalciferol  (VITAMIN D3) 50 MCG (2000 UT) capsule Take 2,000 Units by mouth daily.   ferrous sulfate  325 (65 FE) MG EC tablet Take 325 mg by mouth daily.   fluticasone  (FLONASE ) 50 MCG/ACT nasal spray INSTILL 2 SPRAYS INTO EACH NOSTRIL EVERY DAY   Multiple Vitamin (MULTIVITAMIN) tablet Take 1 tablet by mouth daily.   valACYclovir  (VALTREX ) 500 MG tablet Take 1 tablet (500 mg total) by mouth  daily.   Facility-Administered Encounter Medications as of 12/29/2023  Medication   regadenoson  (LEXISCAN ) injection SOLN 0.4 mg    Allergies (verified) Atorvastatin  and Tamsulosin   History: Past Medical History:  Diagnosis Date   Allergic rhinitis    dust mites   BPH with obstruction/lower urinary tract symptoms    BPPV (benign paroxysmal positional vertigo)    COVID-19 virus infection 03/2019   prolonged post-covid DOE and HR variability.     Elevated PSA    Prostate bx 09/2018 NORMAL. PSA dec to 7.16 Apr 2019, then 5.14 October 2019.   Elevated TSH    2018, not taking biotin. 2020, same-->T4/T3 normal.   Herpes genitalis    Recurrent   History of adenomatous polyp of colon    Hyperlipidemia    statin started 2022/2023   OSA (obstructive sleep apnea) 08/2019   sleep study->severe OSA->CPAP recommended   Pneumonia    covid + lobar, hosp 08/2023   Prediabetes    10/2021 fasting glucose 116, hemoglobin A1c 5.7%   Pulmonary nodule Jan/feb 2021   5 mm R lung; pt low risk for lung ca so NO F/U IMAGING IS INDICATED.   Past Surgical History:  Procedure Laterality Date   CARDIOVASCULAR STRESS TEST  12/02/2021   Myocardial perfusion imaging normal, EF normal.   COLONOSCOPY  2012   2012 Normal.  10/2021 adenoma x 1->recall 5 yrs   History of pneumonia     polysomnogram  08/25/2019   severe OSA->CPAP recommended  PROSTATE BIOPSY  09/2018   NORMAL   SHOULDER SURGERY Right 09/29/2022   RC repair   TRANSTHORACIC ECHOCARDIOGRAM     12/23/21 EF 60 to 65%.  Grade 1 diastolic dysfunction.   ZIO patch  01/26/2020   NORMAL   Family History  Problem Relation Age of Onset   Hearing loss Mother    Hyperlipidemia Mother    Irritable bowel syndrome Sister    Other Brother        elevated PSA   Diabetes Maternal Grandfather    Social History   Socioeconomic History   Marital status: Married    Spouse name: Not on file   Number of children: 2   Years of education: Not on file    Highest education level: Bachelor's degree (e.g., BA, AB, BS)  Occupational History   Occupation: Art gallery manager  Tobacco Use   Smoking status: Never   Smokeless tobacco: Never  Vaping Use   Vaping status: Never Used  Substance and Sexual Activity   Alcohol use: Yes    Comment: occasionally   Drug use: Never   Sexual activity: Yes  Other Topics Concern   Not on file  Social History Narrative   Married, 2 children.   Orig from [place] , relocated to [place] 2018.   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   No tob.   Alc: 2 glasses of wine per night.   Social Drivers of Corporate investment banker Strain: Low Risk  (12/29/2023)   Overall Financial Resource Strain (CARDIA)    Difficulty of Paying Living Expenses: Not hard at all  Food Insecurity: No Food Insecurity (12/29/2023)   Hunger Vital Sign    Worried About Running Out of Food in the Last Year: Never true    Ran Out of Food in the Last Year: Never true  Transportation Needs: No Transportation Needs (12/29/2023)   PRAPARE - Administrator, Civil Service (Medical): No    Lack of Transportation (Non-Medical): No  Physical Activity: Sufficiently Active (12/29/2023)   Exercise Vital Sign    Days of Exercise per Week: 4 days    Minutes of Exercise per Session: 60 min  Stress: No Stress Concern Present (12/29/2023)   [employer] of Occupational Health - Occupational Stress Questionnaire    Feeling of Stress: Not at all  Social Connections: Moderately Integrated (12/29/2023)   Social Connection and Isolation Panel    Frequency of Communication with Friends and Family: More than three times a week    Frequency of Social Gatherings with Friends and Family: More than three times a week    Attends Religious Services: Never    Database administrator or Organizations: Yes    Attends Engineer, structural: 1 to 4 times per year    Marital Status: Married    Tobacco Counseling Counseling given:  Not Answered   Clinical Intake:  Pre-visit preparation completed: Yes  Pain : No/denies pain     Diabetes: No  How often do you need to have someone help you when you read instructions, pamphlets, or other written materials from your doctor or pharmacy?: 1 - Never  Interpreter Needed?: No  Information entered by :: Mliss Graff LPN   Activities of Daily Living    12/29/2023    4:01 PM 09/01/2023    6:56 PM  In your present state of health, do you have any difficulty performing the following activities:  Hearing? 0 0  Vision? 0 0  Difficulty concentrating or making decisions? 0 0  Walking or climbing stairs? 0   Dressing or bathing? 0   Doing errands, shopping? 0   Preparing Food and eating ? N   Using the Toilet? N   In the past six months, have you accidently leaked urine? N   Do you have problems with loss of bowel control? N   Managing your Medications? N   Managing your Finances? N   Housekeeping or managing your Housekeeping? N     Patient Care Team: Candise Aleene DEL, MD as PCP - General (Family Medicine) McKenzie, Belvie CROME, MD as Consulting Physician (Urology) Buck Saucer, MD as Consulting Physician (Neurology) O'Neal, Darryle Ned, MD as Consulting Physician (Cardiology) Burnette Fallow, MD as Consulting Physician (Gastroenterology)  Indicate any recent Medical Services you may have received from other than [facility] providers in the past year (date may be approximate).     Assessment:   This is a routine wellness examination for Rapheal.  Hearing/Vision screen Hearing Screening - Comments:: No trouble hearing Vision Screening - Comments:: Not up to date   Goals Addressed             This Visit's Progress    Patient Stated       Continue current lifestyle       Depression Screen    12/29/2023    4:02 PM 09/07/2023    4:14 PM 02/05/2023    8:01 AM 11/18/2022   11:17 AM 06/09/2022   10:19 AM 02/27/2021    8:49 AM 10/31/2020    8:02 AM  PHQ  2/9 Scores  PHQ - 2 Score 0 0 0 0 0 0 0  PHQ- 9 Score 2          Fall Risk    12/29/2023    3:59 PM 02/05/2023    8:01 AM 11/18/2022   11:19 AM 06/09/2022    9:06 AM 02/27/2021    8:50 AM  Fall Risk   Falls in the past year? 0 0 0 0 0  Number falls in past yr: 0  0  0  Injury with Fall? 0  0  0  Risk for fall due to :   No Fall Risks  No Fall Risks  Follow up Falls evaluation completed;Education provided;Falls prevention discussed Falls evaluation completed Falls prevention discussed  Falls evaluation completed      Data saved with a previous flowsheet row definition    MEDICARE RISK AT HOME: Medicare Risk at Home Any stairs in or around the home?: Yes If so, are there any without handrails?: No Home free of loose throw rugs in walkways, pet beds, electrical cords, etc?: Yes Adequate lighting in your home to reduce risk of falls?: Yes Life alert?: No Use of a cane, walker or w/c?: No Grab bars in the bathroom?: Yes Shower chair or bench in shower?: No Elevated toilet seat or a handicapped toilet?: Yes  TIMED UP AND GO:  Was the test performed?  No    Cognitive Function:        12/29/2023    4:00 PM 11/18/2022   11:21 AM  6CIT Screen  What Year? 0 points 0 points  What month? 0 points 0 points  What time? 0 points 0 points  Count back from 20 0 points 0 points  Months in reverse 0 points 0 points  Repeat phrase 0 points 0 points  Total Score 0 points 0 points  Immunizations Immunization History  Administered Date(s) Administered   Fluad Trivalent(High Dose 65+) 02/05/2023   Influenza,inj,Quad PF,6+ Mos 06/09/2018, 01/15/2020   Influenza-Unspecified 03/09/2022   Moderna Sars-Covid-2 Vaccination 07/27/2019, 08/16/2019   PNEUMOCOCCAL CONJUGATE-20 10/31/2020   Td 04/13/2014   Zoster Recombinant(Shingrix) 10/31/2020, 01/02/2021    TDAP status: Up to date  Flu Vaccine status: Due, Education has been provided regarding the importance of this vaccine. Advised  may receive this vaccine at local pharmacy or Health Dept. Aware to provide a copy of the vaccination record if obtained from local pharmacy or Health Dept. Verbalized acceptance and understanding.  Pneumococcal vaccine status: Up to date  Covid-19 vaccine status: Declined, Education has been provided regarding the importance of this vaccine but patient still declined. Advised may receive this vaccine at local pharmacy or Health Dept.or vaccine clinic. Aware to provide a copy of the vaccination record if obtained from local pharmacy or Health Dept. Verbalized acceptance and understanding.  Qualifies for Shingles Vaccine? No   Zostavax completed Yes   Shingrix Completed?: Yes  Screening Tests Health Maintenance  Topic Date Due   Influenza Vaccine  11/12/2023   COVID-19 Vaccine (3 - 2025-26 season) 01/14/2024 (Originally 12/13/2023)   DTaP/Tdap/Td (2 - Tdap) 04/13/2024   Medicare Annual Wellness (AWV)  12/28/2024   Colonoscopy  11/11/2026   Pneumococcal Vaccine: 50+ Years  Completed   Zoster Vaccines- Shingrix  Completed   HPV VACCINES  Aged Out   Meningococcal B Vaccine  Aged Out   Hepatitis C Screening  Discontinued    Health Maintenance  Health Maintenance Due  Topic Date Due   Influenza Vaccine  11/12/2023    Colorectal cancer screening: Type of screening: Colonoscopy. Completed  . Repeat every 10 years  Lung Cancer Screening: (Low Dose CT Chest recommended if Age 32-80 years, 20 pack-year currently smoking OR have quit w/in 15years.) does not qualify.   Lung Cancer Screening Referral:   Additional Screening:  Hepatitis C Screening  never done  Vision Screening: Recommended annual ophthalmology exams for early detection of glaucoma and other disorders of the eye. Is the patient up to date with their annual eye exam?  No  Who is the provider or what is the name of the office in which the patient attends annual eye exams? Education provided If pt is not established with a  provider, would they like to be referred to a provider to establish care? No .   Dental Screening: Recommended annual dental exams for proper oral hygiene    Community Resource Referral / Chronic Care Management: CRR required this visit?  No   CCM required this visit?  No     Plan:     I have personally reviewed and noted the following in the patient's chart:   Medical and social history Use of alcohol, tobacco or illicit drugs  Current medications and supplements including opioid prescriptions. Patient is not currently taking opioid prescriptions. Functional ability and status Nutritional status Physical activity Advanced directives List of other physicians Hospitalizations, surgeries, and ER visits in previous 12 months Vitals Screenings to include cognitive, depression, and falls Referrals and appointments  In addition, I have reviewed and discussed with patient certain preventive protocols, quality metrics, and best practice recommendations. A written personalized care plan for preventive services as well as general preventive health recommendations were provided to patient.     Mliss Graff, LPN   [id number]   After Visit Summary: (MyChart) Due to this being a telephonic visit, the after visit  summary with patients personalized plan was offered to patient via MyChart   Nurse Notes:

## 2023-12-29 NOTE — Patient Instructions (Signed)
 Christopher Banks , Thank you for taking time to come for your Medicare Wellness Visit. I appreciate your ongoing commitment to your health goals. Please review the following plan we discussed and let me know if I can assist you in the future.   Screening recommendations/referrals: Colonoscopy: up to date Recommended yearly ophthalmology/optometry visit for glaucoma screening and checkup Recommended yearly dental visit for hygiene and checkup  Vaccinations: Influenza vaccine: Education provided Pneumococcal vaccine: up to date Tdap vaccine: up to date Shingles vaccine: up to date     Preventive Care 65 Years and Older, Male Preventive care refers to lifestyle choices and visits with your health care provider that can promote health and wellness. What does preventive care include? A yearly physical exam. This is also called an annual well check. Dental exams once or twice a year. Routine eye exams. Ask your health care provider how often you should have your eyes checked. Personal lifestyle choices, including: Daily care of your teeth and gums. Regular physical activity. Eating a healthy diet. Avoiding tobacco and drug use. Limiting alcohol use. Practicing safe sex. Taking low doses of aspirin every day. Taking vitamin and mineral supplements as recommended by your health care provider. What happens during an annual well check? The services and screenings done by your health care provider during your annual well check will depend on your age, overall health, lifestyle risk factors, and family history of disease. Counseling  Your health care provider may ask you questions about your: Alcohol use. Tobacco use. Drug use. Emotional well-being. Home and relationship well-being. Sexual activity. Eating habits. History of falls. Memory and ability to understand (cognition). Work and work Astronomer. Screening  You may have the following tests or measurements: Height, weight, and  BMI. Blood pressure. Lipid and cholesterol levels. These may be checked every 5 years, or more frequently if you are over 78 years old. Skin check. Lung cancer screening. You may have this screening every year starting at age 54 if you have a 30-pack-year history of smoking and currently smoke or have quit within the past 15 years. Fecal occult blood test (FOBT) of the stool. You may have this test every year starting at age 63. Flexible sigmoidoscopy or colonoscopy. You may have a sigmoidoscopy every 5 years or a colonoscopy every 10 years starting at age 59. Prostate cancer screening. Recommendations will vary depending on your family history and other risks. Hepatitis C blood test. Hepatitis B blood test. Sexually transmitted disease (STD) testing. Diabetes screening. This is done by checking your blood sugar (glucose) after you have not eaten for a while (fasting). You may have this done every 1-3 years. Abdominal aortic aneurysm (AAA) screening. You may need this if you are a current or former smoker. Osteoporosis. You may be screened starting at age 32 if you are at high risk. Talk with your health care provider about your test results, treatment options, and if necessary, the need for more tests. Vaccines  Your health care provider may recommend certain vaccines, such as: Influenza vaccine. This is recommended every year. Tetanus, diphtheria, and acellular pertussis (Tdap, Td) vaccine. You may need a Td booster every 10 years. Zoster vaccine. You may need this after age 76. Pneumococcal 13-valent conjugate (PCV13) vaccine. One dose is recommended after age 66. Pneumococcal polysaccharide (PPSV23) vaccine. One dose is recommended after age 18. Talk to your health care provider about which screenings and vaccines you need and how often you need them. This information is not intended to replace  advice given to you by your health care provider. Make sure you discuss any questions you have  with your health care provider. Document Released: 04/26/2015 Document Revised: 12/18/2015 Document Reviewed: 01/29/2015 Elsevier Interactive Patient Education  2017 [employer].  Fall Prevention in the Home Falls can cause injuries. They can happen to people of all ages. There are many things you can do to make your home safe and to help prevent falls. What can I do on the outside of my home? Regularly fix the edges of walkways and driveways and fix any cracks. Remove anything that might make you trip as you walk through a door, such as a raised step or threshold. Trim any bushes or trees on the path to your home. Use bright outdoor lighting. Clear any walking paths of anything that might make someone trip, such as rocks or tools. Regularly check to see if handrails are loose or broken. Make sure that both sides of any steps have handrails. Any raised decks and porches should have guardrails on the edges. Have any leaves, snow, or ice cleared regularly. Use sand or salt on walking paths during winter. Clean up any spills in your garage right away. This includes oil or grease spills. What can I do in the bathroom? Use night lights. Install grab bars by the toilet and in the tub and shower. Do not use towel bars as grab bars. Use non-skid mats or decals in the tub or shower. If you need to sit down in the shower, use a plastic, non-slip stool. Keep the floor dry. Clean up any water that spills on the floor as soon as it happens. Remove soap buildup in the tub or shower regularly. Attach bath mats securely with double-sided non-slip rug tape. Do not have throw rugs and other things on the floor that can make you trip. What can I do in the bedroom? Use night lights. Make sure that you have a light by your bed that is easy to reach. Do not use any sheets or blankets that are too big for your bed. They should not hang down onto the floor. Have a firm chair that has side arms. You can use  this for support while you get dressed. Do not have throw rugs and other things on the floor that can make you trip. What can I do in the kitchen? Clean up any spills right away. Avoid walking on wet floors. Keep items that you use a lot in easy-to-reach places. If you need to reach something above you, use a strong step stool that has a grab bar. Keep electrical cords out of the way. Do not use floor polish or wax that makes floors slippery. If you must use wax, use non-skid floor wax. Do not have throw rugs and other things on the floor that can make you trip. What can I do with my stairs? Do not leave any items on the stairs. Make sure that there are handrails on both sides of the stairs and use them. Fix handrails that are broken or loose. Make sure that handrails are as long as the stairways. Check any carpeting to make sure that it is firmly attached to the stairs. Fix any carpet that is loose or worn. Avoid having throw rugs at the top or bottom of the stairs. If you do have throw rugs, attach them to the floor with carpet tape. Make sure that you have a light switch at the top of the stairs and the bottom  of the stairs. If you do not have them, ask someone to add them for you. What else can I do to help prevent falls? Wear shoes that: Do not have high heels. Have rubber bottoms. Are comfortable and fit you well. Are closed at the toe. Do not wear sandals. If you use a stepladder: Make sure that it is fully opened. Do not climb a closed stepladder. Make sure that both sides of the stepladder are locked into place. Ask someone to hold it for you, if possible. Clearly mark and make sure that you can see: Any grab bars or handrails. First and last steps. Where the edge of each step is. Use tools that help you move around (mobility aids) if they are needed. These include: Canes. Walkers. Scooters. Crutches. Turn on the lights when you go into a dark area. Replace any light bulbs  as soon as they burn out. Set up your furniture so you have a clear path. Avoid moving your furniture around. If any of your floors are uneven, fix them. If there are any pets around you, be aware of where they are. Review your medicines with your doctor. Some medicines can make you feel dizzy. This can increase your chance of falling. Ask your doctor what other things that you can do to help prevent falls. This information is not intended to replace advice given to you by your health care provider. Make sure you discuss any questions you have with your health care provider. Document Released: 01/24/2009 Document Revised: 09/05/2015 Document Reviewed: 05/04/2014 Elsevier Interactive Patient Education  2017 [employer].

## 2024-01-19 ENCOUNTER — Ambulatory Visit: Payer: Medicare HMO | Admitting: Family Medicine

## 2024-01-27 NOTE — Patient Instructions (Addendum)
 Please continue using your CPAP regularly. While your insurance requires that you use CPAP at least 4 hours each night on 70% of the nights, I recommend, that you not skip any nights and use it throughout the night if you can. Getting used to CPAP and staying with the treatment long term does take time and patience and discipline. Untreated obstructive sleep apnea when it is moderate to severe can have an adverse impact on cardiovascular health and raise her risk for heart disease, arrhythmias, hypertension, congestive heart failure, stroke and diabetes. Untreated obstructive sleep apnea causes sleep disruption, nonrestorative sleep, and sleep deprivation. This can have an impact on your day to day functioning and cause daytime sleepiness and impairment of cognitive function, memory loss, mood disturbance, and problems focussing. Using CPAP regularly can improve these symptoms.  We will update supply orders, today. Consider changing your mask to see if you can find a more comfortable fit.  [facility] Phone: [phone].   Follow up in 1 year with Dr Buck to discuss alternative options to manage sleep apnea should you wish to switch therapy.

## 2024-01-27 NOTE — Progress Notes (Signed)
 PATIENT: Christopher Banks DOB: [birth year]  REASON FOR VISIT: follow up HISTORY FROM: patient  Chief Complaint  Patient presents with   RM1/CPAP    Pt is here Alone. Pt states that things are going fair with his CPAP Machine. ESS 3     HISTORY OF PRESENT ILLNESS:  01/31/24 ALL: Christopher Banks returns for follow up for OSA on CPAP. He has continued CPAP therapy most nights for about 6.5 hours, on average. He continues to feel therapy is not comfortable. He uses a FFM. He continues to note leak unless he tightens the headgear to a point that it is not comfortable. He has not tried a different mask. He feels nasal congestion is well managed with Flonase . He also reports difficulty maintaining sleep. He wakes around 3-4am several mornings a week. He reports being unable to return to sleep due to being uncomfortable using CPAP. He tried melatonin with little benefit. He is interested in other options to manage sleep apnea but is willing to try mask refitting.     01/13/2023 ALL:  Christopher Banks returns for follow up for OSA on CPAP. He continues therapy but admits that he does not like using CPAP. He feels that he can not get mask sealed unless he tightens it so much that it is uncomfortable. He is using a traditional FFM. He reports significant allergies and congestion. Unsure if he could tolerate a nasal mask. He is interested in other treatment options for management of apnea.     05/27/20 ALL:  Christopher Banks is a 68 y.o. male here today for follow up for OSA on CPAP.  He reports that he is doing well with CPAP therapy.  He has no longer snoring.  He seems to be resting better.  He does continue to note a leak in his mask.  He is working with the DME company to find a mask that works best for him.  Compliance report dated 04/27/2020 through 05/26/2020 reveals that he used CPAP 30 of the past 30 days for compliance of 100%.  He is CPAP greater than 4 hours 29 of the past 30 days for compliance of 97%.   Average usage on days used was 7 hours.  Residual AHI was 1.3 on 7 to 14 cm of water and an EPR of 1.  There was a leak noted in the 95th percentile of 27.2 L/min.  11/21/2019 ALL: Christopher Banks is a 68 y.o. male here today for follow up for recently diagnosed OSA started on CPAP therapy. HST on 5/12 showed severe obstructive sleep apnea with a total AHI of 39.1/hour and O2 nadir of 78%. He reports that he is adjusting to therapy. He is using a full face mask and feels that he is doing fairly well. He does note a leak from time to time but has been monitoring this at home. He does not sleep well. He is restless. He exercises every morning. He is getting about 7 hours of sleep nightly.   Compliance report dated 10/21/2019 through 11/19/2019 reveals that he has used CPAP therapy 30 of the past 30 days for compliance of 100%. He is CPAP greater than 4 hours 29 in the past 30 days for compliance of 97%. Average usage was 7 hours and 25 minutes. Residual AHI was 3.9 on 7 to 14 cm of water and EPR 1. There was a leak noted in the 95th percentile of 23.5 L/min.  HISTORY: (copied from Dr Obie note on 07/27/2019)  Dear Dr. Candise,  I saw your patient, Christopher Banks, upon your kind request of a sleep clinic today for initial consultation of his sleep disorder combined particular, concern for underlying obstructive sleep apnea.  The patient is unaccompanied today.  As you know, Christopher Banks is a 68 year old right-handed gentleman with an underlying medical history of hyperlipidemia, elevated TSH, elevated PSA, vertigo, allergic rhinitis, history of Covid diagnosis in December 2020, pulmonary nodule, and mildly overweight state, who reports snoring and excessive daytime somnolence at times as well as witnessed apneas per wife's report.  I reviewed your office note from 07/13/2019.  His Epworth sleepiness score is 10 out of 24, fatigue severity score is 28 out of 63.  He lives with his wife, they have 2 grandchildren  and first grandchild on the way.  He is a non-smoker and drinks alcohol in the form of wine, 1 to 2 glasses/day.  He limits his caffeine to 1 cup of coffee per day on average.  He exercises in the mornings.  He goes to bed around 10 and rise time is typically between 5 and 5:45 AM.  He is often awake early.  He has nocturia about 2-3 times per average night and denies recurrent morning headaches.  He has had intermittent vertigo symptoms.  He works as an Art gallery manager, currently back in the office.  He has a TV in the bedroom but does not watch it.  They have 2 small dogs in the household, both sleep in the bedroom, one of them typically on the bed with them.  He has no obvious family history of sleep apnea.  He has woken up with a leg cramp but denies any telltale symptoms of restless leg syndrome.   REVIEW OF SYSTEMS: Out of a complete 14 system review of symptoms, the patient complains only of the following symptoms, restless sleep and all other reviewed systems are negative.  ESS: 6/24, previously 8 FSS: 22  ALLERGIES: Allergies  Allergen Reactions   Atorvastatin      Indigestion, trouble sleeping   Tamsulosin Other (See Comments)    lightheadedness    HOME MEDICATIONS: Outpatient Medications Prior to Visit  Medication Sig Dispense Refill   Cholecalciferol  (VITAMIN D3) 50 MCG (2000 UT) capsule Take 2,000 Units by mouth daily.     ferrous sulfate  325 (65 FE) MG EC tablet Take 325 mg by mouth daily.     fluticasone  (FLONASE ) 50 MCG/ACT nasal spray INSTILL 2 SPRAYS INTO EACH NOSTRIL EVERY DAY 16 g 1   Multiple Vitamin (MULTIVITAMIN) tablet Take 1 tablet by mouth daily.     valACYclovir  (VALTREX ) 500 MG tablet Take 1 tablet (500 mg total) by mouth daily. 90 tablet 3   Facility-Administered Medications Prior to Visit  Medication Dose Route Frequency Provider Last Rate Last Admin   regadenoson  (LEXISCAN ) injection SOLN 0.4 mg  0.4 mg Intravenous Once Crenshaw, Brian S, MD        PAST MEDICAL  HISTORY: Past Medical History:  Diagnosis Date   Allergic rhinitis    dust mites   BPH with obstruction/lower urinary tract symptoms    BPPV (benign paroxysmal positional vertigo)    COVID-19 virus infection 03/2019   prolonged post-covid DOE and HR variability.     Elevated PSA    Prostate bx 09/2018 NORMAL. PSA dec to 7.16 Apr 2019, then 5.14 October 2019.   Elevated TSH    2018, not taking biotin. 2020, same-->T4/T3 normal.   Herpes genitalis    Recurrent   History of adenomatous polyp  of colon    Hyperlipidemia    statin started 2022/2023   OSA (obstructive sleep apnea) 08/2019   sleep study->severe OSA->CPAP recommended   Pneumonia    covid + lobar, hosp 08/2023   Prediabetes    10/2021 fasting glucose 116, hemoglobin A1c 5.7%   Pulmonary nodule Jan/feb 2021   5 mm R lung; pt low risk for lung ca so NO F/U IMAGING IS INDICATED.    PAST SURGICAL HISTORY: Past Surgical History:  Procedure Laterality Date   CARDIOVASCULAR STRESS TEST  12/02/2021   Myocardial perfusion imaging normal, EF normal.   COLONOSCOPY  2012   2012 Normal.  10/2021 adenoma x 1->recall 5 yrs   History of pneumonia     polysomnogram  08/25/2019   severe OSA->CPAP recommended   PROSTATE BIOPSY  09/2018   NORMAL   SHOULDER SURGERY Right 09/29/2022   RC repair   TRANSTHORACIC ECHOCARDIOGRAM     12/23/21 EF 60 to 65%.  Grade 1 diastolic dysfunction.   ZIO patch  01/26/2020   NORMAL    FAMILY HISTORY: Family History  Problem Relation Age of Onset   Hearing loss Mother    Hyperlipidemia Mother    Irritable bowel syndrome Sister    Other Brother        elevated PSA   Diabetes Maternal Grandfather     SOCIAL HISTORY: Social History   Socioeconomic History   Marital status: Married    Spouse name: Not on file   Number of children: 2   Years of education: Not on file   Highest education level: Bachelor's degree (e.g., BA, AB, BS)  Occupational History   Occupation: Art gallery manager  Tobacco Use    Smoking status: Never   Smokeless tobacco: Never  Vaping Use   Vaping status: Never Used  Substance and Sexual Activity   Alcohol use: Yes    Comment: occasionally   Drug use: Never   Sexual activity: Yes  Other Topics Concern   Not on file  Social History Narrative   Married, 2 children.   Orig from [place] , relocated to [facility] 2018.   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   No tob.   Alc: 2 glasses of wine per night.   Social Drivers of Corporate investment banker Strain: Low Risk  (12/29/2023)   Overall Financial Resource Strain (CARDIA)    Difficulty of Paying Living Expenses: Not hard at all  Food Insecurity: No Food Insecurity (12/29/2023)   Hunger Vital Sign    Worried About Running Out of Food in the Last Year: Never true    Ran Out of Food in the Last Year: Never true  Transportation Needs: No Transportation Needs (12/29/2023)   PRAPARE - Administrator, Civil Service (Medical): No    Lack of Transportation (Non-Medical): No  Physical Activity: Sufficiently Active (12/29/2023)   Exercise Vital Sign    Days of Exercise per Week: 4 days    Minutes of Exercise per Session: 60 min  Stress: No Stress Concern Present (12/29/2023)   [employer] of Occupational Health - Occupational Stress Questionnaire    Feeling of Stress: Not at all  Social Connections: Moderately Integrated (12/29/2023)   Social Connection and Isolation Panel    Frequency of Communication with Friends and Family: More than three times a week    Frequency of Social Gatherings with Friends and Family: More than three times a week    Attends Religious Services: Never  Active Member of Clubs or Organizations: Yes    Attends Banker Meetings: 1 to 4 times per year    Marital Status: Married  Catering manager Violence: Not At Risk (12/29/2023)   Humiliation, Afraid, Rape, and Kick questionnaire    Fear of Current or Ex-Partner: No    Emotionally Abused: No     Physically Abused: No    Sexually Abused: No      PHYSICAL EXAM  Vitals:   01/31/24 0954  BP: 126/80  Pulse: (!) 58  SpO2: 99%  Weight: 183 lb 8 oz (83.2 kg)  Height: 5' 10 (1.778 m)     Body mass index is 26.33 kg/m.  Generalized: Well developed, in no acute distress  Cardiology: normal rate and rhythm, no murmur noted Respiratory: clear to auscultation bilaterally  Neurological examination  Mentation: Alert oriented to time, place, history taking. Follows all commands speech and language fluent Cranial nerve II-XII: Pupils were equal round reactive to light. Extraocular movements were full, visual field were full  Motor: The motor testing reveals 5 over 5 strength of all 4 extremities. Good symmetric motor tone is noted throughout.  Gait and station: Gait is normal.    DIAGNOSTIC DATA (LABS, IMAGING, TESTING) - I reviewed patient records, labs, notes, testing and imaging myself where available.      No data to display           Lab Results  Component Value Date   WBC 5.6 09/14/2023   HGB 12.7 (L) 09/14/2023   HCT 38.9 09/14/2023   MCV 101.3 (H) 09/14/2023   PLT 464 (H) 09/14/2023      Component Value Date/Time   NA 136 09/14/2023 1135   NA 140 06/12/2016 0000   K 4.4 09/14/2023 1135   CL 102 09/14/2023 1135   CO2 26 09/14/2023 1135   GLUCOSE 102 (H) 09/14/2023 1135   BUN 16 09/14/2023 1135   BUN 16 06/12/2016 0000   CREATININE 0.93 09/14/2023 1135   CALCIUM  9.2 09/14/2023 1135   PROT 6.9 09/14/2023 1135   ALBUMIN 3.0 (L) 09/02/2023 0817   AST 28 09/14/2023 1135   ALT 29 09/14/2023 1135   ALKPHOS 48 09/02/2023 0817   BILITOT 0.6 09/14/2023 1135   GFRNONAA >60 09/03/2023 0508   Lab Results  Component Value Date   CHOL 217 (H) 02/05/2023   HDL 82.10 02/05/2023   LDLCALC 126 (H) 02/05/2023   TRIG 48.0 02/05/2023   CHOLHDL 3 02/05/2023   Lab Results  Component Value Date   HGBA1C 5.6 02/05/2023   Lab Results  Component Value Date    VITAMINB12 746 06/24/2016   Lab Results  Component Value Date   TSH 2.97 02/05/2023     ASSESSMENT AND PLAN 68 y.o. year old male  has a past medical history of Allergic rhinitis, BPH with obstruction/lower urinary tract symptoms, BPPV (benign paroxysmal positional vertigo), COVID-19 virus infection (03/2019), Elevated PSA, Elevated TSH, Herpes genitalis, History of adenomatous polyp of colon, Hyperlipidemia, OSA (obstructive sleep apnea) (08/2019), Pneumonia, Prediabetes, and Pulmonary nodule (Jan/feb 2021). here with     ICD-10-CM   1. OSA on CPAP  G47.33 For home use only DME continuous positive airway pressure (CPAP)    2. Difficulty using continuous positive airway pressure (CPAP) full face mask  Z78.9     3. Nasal congestion  R09.81        Christopher Banks is doing well on CPAP therapy. Compliance report reveals optimal compliance. We reviewed his sleep  study results and discussed options for management of apnea. I feel that CPAP is the best treatment option for him at this time. Could consider an oral appliance but may not be as effective. I am not sure Elizabeth would be a good fit due to O2 nadir. He was encouraged to continue using CPAP nightly and for greater than 4 hours each night. He will continue to monitor for a leak at home. Mask refitting orders placed.  He will follow-up in 1 year with Dr Buck to discuss alternative treatment options if needed. He verbalizes understanding and agreement with this plan.   Orders Placed This Encounter  Procedures   For home use only DME continuous positive airway pressure (CPAP)    Mask refitting due to leak, Heated Humidity with all supplies as needed    Length of Need:   Lifetime    Patient has OSA or probable OSA:   Yes    Is the patient currently using CPAP in the home:   Yes    Settings:   Other see comments    CPAP supplies needed:   Mask, headgear, cushions, filters, heated tubing and water chamber     No orders of the defined types  were placed in this encounter.     Greig Forbes, FNP-C 01/31/2024, 10:26 AM [facility] [address], [address] [place], [place] [address] [phone]

## 2024-01-27 NOTE — Progress Notes (Signed)
 SABRA

## 2024-01-31 ENCOUNTER — Ambulatory Visit: Payer: Self-pay | Admitting: Family Medicine

## 2024-01-31 ENCOUNTER — Encounter: Payer: Self-pay | Admitting: Family Medicine

## 2024-01-31 VITALS — BP 126/80 | HR 58 | Ht 70.0 in | Wt 183.5 lb

## 2024-01-31 DIAGNOSIS — Z789 Other specified health status: Secondary | ICD-10-CM

## 2024-01-31 DIAGNOSIS — G4733 Obstructive sleep apnea (adult) (pediatric): Secondary | ICD-10-CM

## 2024-01-31 DIAGNOSIS — R0981 Nasal congestion: Secondary | ICD-10-CM

## 2024-02-14 HISTORY — PX: TOOTH EXTRACTION: SUR596

## 2024-02-16 ENCOUNTER — Ambulatory Visit (INDEPENDENT_AMBULATORY_CARE_PROVIDER_SITE_OTHER): Admitting: Family Medicine

## 2024-02-16 ENCOUNTER — Encounter: Payer: Self-pay | Admitting: Family Medicine

## 2024-02-16 VITALS — BP 107/63 | HR 67 | Temp 97.5°F | Ht 70.75 in | Wt 182.4 lb

## 2024-02-16 DIAGNOSIS — Z0000 Encounter for general adult medical examination without abnormal findings: Secondary | ICD-10-CM | POA: Diagnosis not present

## 2024-02-16 DIAGNOSIS — E7800 Pure hypercholesterolemia, unspecified: Secondary | ICD-10-CM

## 2024-02-16 DIAGNOSIS — Z125 Encounter for screening for malignant neoplasm of prostate: Secondary | ICD-10-CM | POA: Diagnosis not present

## 2024-02-16 DIAGNOSIS — Z23 Encounter for immunization: Secondary | ICD-10-CM | POA: Diagnosis not present

## 2024-02-16 DIAGNOSIS — E038 Other specified hypothyroidism: Secondary | ICD-10-CM | POA: Diagnosis not present

## 2024-02-16 DIAGNOSIS — D649 Anemia, unspecified: Secondary | ICD-10-CM

## 2024-02-16 DIAGNOSIS — R7301 Impaired fasting glucose: Secondary | ICD-10-CM | POA: Diagnosis not present

## 2024-02-16 LAB — PSA, MEDICARE: PSA: 7.6 ng/mL — ABNORMAL HIGH (ref 0.10–4.00)

## 2024-02-16 NOTE — Patient Instructions (Addendum)
 Eat a diet that includes plenty of vegetables, fruits, low-fat dairy products, and lean protein. Do not eat a lot of foods that are high in solid fats, added sugars, or sodium. Maintain a healthy weight Body mass index (BMI) is a measurement that can be used to identify possible weight problems. It estimates body fat based on height and weight. Your health care provider can help determine your BMI and help you achieve or maintain a healthy weight. Get regular exercise Get regular exercise. This is one of the most important things you can do for your health. Most adults should: Exercise for at least 150 minutes each week. The exercise should increase your heart rate and make you sweat (moderate-intensity exercise). Do strengthening exercises at least twice a week. This is in addition to the moderate-intensity exercise. Spend less time sitting. Even light physical activity can be beneficial. Watch cholesterol and blood lipids Have your blood tested for lipids and cholesterol at 69 years of age, then have this test every 5 years. You may need to have your cholesterol levels checked more often if: Your lipid or cholesterol levels are high. You are older than 69 years of age. You are at high risk for heart disease. What should I know about cancer screening? Many types of cancers can be detected early and may often be prevented. Depending on your health history and family history, you may need to have cancer screening at various ages. This may include screening for: Colorectal cancer. Prostate cancer. Skin cancer. Lung cancer. What should I know about heart disease, diabetes, and high blood pressure? Blood pressure and heart disease High blood pressure causes heart disease and increases the risk of stroke. This is more likely to develop in people who have high blood pressure readings or are overweight. Talk with your health care provider about your target blood pressure readings. Have your blood  pressure checked: Every 3-5 years if you are 30-15 years of age. Every year if you are 21 years old or older. If you are between the ages of 74 and 84 and are a current or former smoker, ask your health care provider if you should have a one-time screening for abdominal aortic aneurysm (AAA). Diabetes Have regular diabetes screenings. This checks your fasting blood sugar level. Have the screening done: Once every three years after age 91 if you are at a normal weight and have a low risk for diabetes. More often and at a younger age if you are overweight or have a high risk for diabetes. What should I know about preventing infection? Hepatitis B If you have a higher risk for hepatitis B, you should be screened for this virus. Talk with your health care provider to find out if you are at risk for hepatitis B infection. Hepatitis C Blood testing is recommended for: Everyone born from [birth year] through 1966. Anyone with known risk factors for hepatitis C. Sexually transmitted infections (STIs) You should be screened each year for STIs, including gonorrhea and chlamydia, if: You are sexually active and are younger than 69 years of age. You are older than 69 years of age and your health care provider tells you that you are at risk for this type of infection. Your sexual activity has changed since you were last screened, and you are at increased risk for chlamydia or gonorrhea. Ask your health care provider if you are at risk. Ask your health care provider about whether you are at high risk for HIV. Your health care provider  may recommend a prescription medicine to help prevent HIV infection. If you choose to take medicine to prevent HIV, you should first get tested for HIV. You should then be tested every 3 months for as long as you are taking the medicine. Follow these instructions at home: Alcohol use Do not drink alcohol if your health care provider tells you not to drink. If you drink  alcohol: Limit how much you have to 0-2 drinks a day. Know how much alcohol is in your drink. In the [place] one drink equals one 12 oz bottle of beer (355 mL), one 5 oz glass of wine (148 mL), or one 1 oz glass of hard liquor (44 mL). Lifestyle Do not use any products that contain nicotine or tobacco. These products include cigarettes, chewing tobacco, and vaping devices, such as e-cigarettes. If you need help quitting, ask your health care provider. Do not use street drugs. Do not share needles. Ask your health care provider for help if you need support or information about quitting drugs. General instructions Schedule regular health, dental, and eye exams. Stay current with your vaccines. Tell your health care provider if: You often feel depressed. You have ever been abused or do not feel safe at home. Summary Adopting a healthy lifestyle and getting preventive care are important in promoting health and wellness. Follow your health care provider's instructions about healthy diet, exercising, and getting tested or screened for diseases. Follow your health care provider's instructions on monitoring your cholesterol and blood pressure. This information is not intended to replace advice given to you by your health care provider. Make sure you discuss any questions you have with your health care provider. Document Revised: 08/19/2020 Document Reviewed: 08/19/2020 Elsevier Patient Education  2024 [employer].

## 2024-02-16 NOTE — Progress Notes (Signed)
 Office Note 02/16/2024  CC:  Chief Complaint  Patient presents with   Annual Exam    Pt is fasting    HPI:  Patient is a 68 y.o. male who is here for annual health maintenance exam. Feeling well.  He works out regularly and eats a healthy diet. He takes iron and vitamin D  supplement.  Past Medical History:  Diagnosis Date   Allergic rhinitis    dust mites   BPH with obstruction/lower urinary tract symptoms    BPPV (benign paroxysmal positional vertigo)    COVID-19 virus infection 03/2019   prolonged post-covid DOE and HR variability.     Elevated PSA    Prostate bx 09/2018 NORMAL. PSA dec to 7.16 Apr 2019, then 5.14 October 2019.   Elevated TSH    2018, not taking biotin. 2020, same-->T4/T3 normal.   Herpes genitalis    Recurrent   History of adenomatous polyp of colon    Hyperlipidemia    statin started 2022/2023   OSA (obstructive sleep apnea) 08/2019   sleep study->severe OSA->CPAP recommended   Oxygen deficiency    Pneumonia    covid + lobar, hosp 08/2023   Prediabetes    10/2021 fasting glucose 116, hemoglobin A1c 5.7%   Pulmonary nodule Jan/feb 2021   5 mm R lung; pt low risk for lung ca so NO F/U IMAGING IS INDICATED.   Sleep apnea 09/12/2019   CPAP Machine    Past Surgical History:  Procedure Laterality Date   CARDIOVASCULAR STRESS TEST  12/02/2021   Myocardial perfusion imaging normal, EF normal.   COLONOSCOPY  2012   2012 Normal.  10/2021 adenoma x 1->recall 5 yrs   History of pneumonia     polysomnogram  08/25/2019   severe OSA->CPAP recommended   PROSTATE BIOPSY  09/2018   NORMAL   SHOULDER SURGERY Right 09/29/2022   RC repair   TOOTH EXTRACTION  02/14/2024   TRANSTHORACIC ECHOCARDIOGRAM     12/23/21 EF 60 to 65%.  Grade 1 diastolic dysfunction.   ZIO patch  01/26/2020   NORMAL    Family History  Problem Relation Age of Onset   Hearing loss Mother    Hyperlipidemia Mother    Irritable bowel syndrome Sister    Other Brother        elevated  PSA   Diabetes Maternal Grandfather     Social History   Socioeconomic History   Marital status: Married    Spouse name: Not on file   Number of children: 2   Years of education: Not on file   Highest education level: Bachelor's degree (e.g., BA, AB, BS)  Occupational History   Occupation: Art Gallery Manager  Tobacco Use   Smoking status: Never   Smokeless tobacco: Never  Vaping Use   Vaping status: Never Used  Substance and Sexual Activity   Alcohol use: Yes    Alcohol/week: 18.0 standard drinks of alcohol    Types: 18 Glasses of wine per week    Comment: occasionally   Drug use: Never   Sexual activity: Yes    Birth control/protection: Post-menopausal  Other Topics Concern   Not on file  Social History Narrative   Married, 2 children.   Orig from [place] , relocated to [facility] 2018.   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   No tob.   Alc: 2 glasses of wine per night.   Social Drivers of Health   Financial Resource Strain: Low Risk  (02/14/2024)  Overall Financial Resource Strain (CARDIA)    Difficulty of Paying Living Expenses: Not hard at all  Food Insecurity: No Food Insecurity (02/14/2024)   Hunger Vital Sign    Worried About Running Out of Food in the Last Year: Never true    Ran Out of Food in the Last Year: Never true  Transportation Needs: No Transportation Needs (02/14/2024)   PRAPARE - Administrator, Civil Service (Medical): No    Lack of Transportation (Non-Medical): No  Physical Activity: Sufficiently Active (02/14/2024)   Exercise Vital Sign    Days of Exercise per Week: 5 days    Minutes of Exercise per Session: 150+ min  Stress: Stress Concern Present (02/14/2024)   [employer] of Occupational Health - Occupational Stress Questionnaire    Feeling of Stress: Rather much  Social Connections: Moderately Isolated (02/14/2024)   Social Connection and Isolation Panel    Frequency of Communication with Friends and Family: Once a  week    Frequency of Social Gatherings with Friends and Family: Twice a week    Attends Religious Services: Never    Database Administrator or Organizations: No    Attends Engineer, Structural: Not on file    Marital Status: Married  Catering Manager Violence: Not At Risk (12/29/2023)   Humiliation, Afraid, Rape, and Kick questionnaire    Fear of Current or Ex-Partner: No    Emotionally Abused: No    Physically Abused: No    Sexually Abused: No    Outpatient Medications Prior to Visit  Medication Sig Dispense Refill   Cholecalciferol  (VITAMIN D3) 50 MCG (2000 UT) capsule Take 2,000 Units by mouth daily.     ferrous sulfate  325 (65 FE) MG EC tablet Take 325 mg by mouth daily.     fluticasone  (FLONASE ) 50 MCG/ACT nasal spray INSTILL 2 SPRAYS INTO EACH NOSTRIL EVERY DAY 16 g 1   Multiple Vitamin (MULTIVITAMIN) tablet Take 1 tablet by mouth daily.     valACYclovir  (VALTREX ) 500 MG tablet Take 1 tablet (500 mg total) by mouth daily. 90 tablet 3   regadenoson  (LEXISCAN ) injection SOLN 0.4 mg      No facility-administered medications prior to visit.    Allergies  Allergen Reactions   Atorvastatin      Indigestion, trouble sleeping   Tamsulosin Other (See Comments)    lightheadedness    Review of Systems  Constitutional:  Negative for appetite change, chills, fatigue and fever.  HENT:  Negative for congestion, dental problem, ear pain and sore throat.   Eyes:  Negative for discharge, redness and visual disturbance.  Respiratory:  Negative for cough, chest tightness, shortness of breath and wheezing.   Cardiovascular:  Negative for chest pain, palpitations and leg swelling.  Gastrointestinal:  Negative for abdominal pain, blood in stool, diarrhea, nausea and vomiting.  Genitourinary:  Negative for difficulty urinating, dysuria, flank pain, frequency, hematuria and urgency.  Musculoskeletal:  Negative for arthralgias, back pain, joint swelling, myalgias and neck stiffness.   Skin:  Negative for pallor and rash.  Neurological:  Negative for dizziness, speech difficulty, weakness and headaches.  Hematological:  Negative for adenopathy. Does not bruise/bleed easily.  Psychiatric/Behavioral:  Negative for confusion and sleep disturbance. The patient is not nervous/anxious.     PE;    02/16/2024    8:09 AM 01/31/2024    9:54 AM 09/14/2023   11:10 AM  Vitals with BMI  Height 5' 10.75 5' 10 5' 10  Weight 182 lbs 6  oz 183 lbs 8 oz 172 lbs 3 oz  BMI 25.62 26.33 24.71  Systolic 107 126 899  Diastolic 63 80 56  Pulse 67 58 73   Gen: Alert, well appearing.  Patient is oriented to person, place, time, and situation. AFFECT: pleasant, lucid thought and speech. ENT: Ears: EACs clear, normal epithelium.  TMs with good light reflex and landmarks bilaterally.  Eyes: no injection, icteris, swelling, or exudate.  EOMI, PERRLA. Nose: no drainage or turbinate edema/swelling.  No injection or focal lesion.  Mouth: lips without lesion/swelling.  Oral mucosa pink and moist.  Dentition intact and without obvious caries or gingival swelling.  Oropharynx without erythema, exudate, or swelling.  Neck: supple/nontender.  No LAD, mass, or TM.  Carotid pulses 2+ bilaterally, without bruits. CV: RRR, no m/r/g.   LUNGS: CTA bilat, nonlabored resps, good aeration in all lung fields. ABD: soft, NT, ND, BS normal.  No hepatospenomegaly or mass.  No bruits. EXT: no clubbing, cyanosis, or edema.  Musculoskeletal: no joint swelling, erythema, warmth, or tenderness.  ROM of all joints intact. Skin - no sores or suspicious lesions or rashes or color changes  Pertinent labs:  Lab Results  Component Value Date   TSH 2.97 02/05/2023   Lab Results  Component Value Date   WBC 5.6 09/14/2023   HGB 12.7 (L) 09/14/2023   HCT 38.9 09/14/2023   MCV 101.3 (H) 09/14/2023   PLT 464 (H) 09/14/2023   Lab Results  Component Value Date   IRON 100 10/31/2020   TIBC 346 10/31/2020   FERRITIN 110  10/31/2020   Lab Results  Component Value Date   IRON 100 10/31/2020   TIBC 346 10/31/2020   FERRITIN 110 10/31/2020   Lab Results  Component Value Date   CREATININE 0.93 09/14/2023   BUN 16 09/14/2023   NA 136 09/14/2023   K 4.4 09/14/2023   CL 102 09/14/2023   CO2 26 09/14/2023   Lab Results  Component Value Date   ALT 29 09/14/2023   AST 28 09/14/2023   ALKPHOS 48 09/02/2023   BILITOT 0.6 09/14/2023   Lab Results  Component Value Date   CHOL 217 (H) 02/05/2023   Lab Results  Component Value Date   HDL 82.10 02/05/2023   Lab Results  Component Value Date   LDLCALC 126 (H) 02/05/2023   Lab Results  Component Value Date   TRIG 48.0 02/05/2023   Lab Results  Component Value Date   CHOLHDL 3 02/05/2023   Lab Results  Component Value Date   PSA 6.29 04/13/2023   PSA 7.50 (H) 02/05/2023   PSA 5.08 (H) 11/03/2021   Lab Results  Component Value Date   HGBA1C 5.6 02/05/2023   ASSESSMENT AND PLAN:  Last vitamin D  Lab Results  Component Value Date   VD25OH 42.16 11/03/2021    #1 health maintenance exam: Reviewed age and gender appropriate health maintenance issues (prudent diet, regular exercise, health risks of tobacco and excessive alcohol, use of seatbelts, fire alarms in home, use of sunscreen).  Also reviewed age and gender appropriate health screening as well as vaccine recommendations. Vaccines: All UTD. Labs: cbc, cmet, flp, thyroid  panel (Hx of subclin hypothyr), PSA. Prostate ca screening: History of elevated PSA ->biopsy benign in the past.  He had a period time where he got regular follow-up with his urologist --> check PSA today. Colon ca screening: [place] polyps [place] ([place] and 2023 TCS ([place])-->recall 2028.  An After Visit Summary was  printed and given to the patient.  FOLLOW UP:  Return in about 1 year (around 02/15/2025) for annual CPE (fasting.  Signed:  Gerlene Hockey, MD           02/16/2024

## 2024-02-17 ENCOUNTER — Ambulatory Visit: Payer: Self-pay | Admitting: Family Medicine

## 2024-02-17 DIAGNOSIS — E7800 Pure hypercholesterolemia, unspecified: Secondary | ICD-10-CM

## 2024-02-17 DIAGNOSIS — D7589 Other specified diseases of blood and blood-forming organs: Secondary | ICD-10-CM

## 2024-02-17 LAB — LIPID PANEL
Cholesterol: 246 mg/dL — ABNORMAL HIGH (ref <200)
HDL: 83 mg/dL (ref >=40)
LDL Cholesterol (Calc): 147 mg/dL — ABNORMAL HIGH
Non-HDL Cholesterol (Calc): 163 mg/dL — ABNORMAL HIGH (ref <130)
Total CHOL/HDL Ratio: 3 (calc) (ref <5.0)
Triglycerides: 61 mg/dL (ref <150)

## 2024-02-17 LAB — COMPREHENSIVE METABOLIC PANEL WITH GFR
AG Ratio: 1.7 (calc) (ref 1.0–2.5)
ALT: 23 U/L (ref 9–46)
AST: 41 U/L — ABNORMAL HIGH (ref 10–35)
Albumin: 4.5 g/dL (ref 3.6–5.1)
Alkaline phosphatase (APISO): 58 U/L (ref 35–144)
BUN: 12 mg/dL (ref 7–25)
CO2: 28 mmol/L (ref 20–32)
Calcium: 9.5 mg/dL (ref 8.6–10.3)
Chloride: 101 mmol/L (ref 98–110)
Creat: 1.23 mg/dL (ref 0.70–1.35)
Globulin: 2.6 g/dL (ref 1.9–3.7)
Glucose, Bld: 94 mg/dL (ref 65–99)
Potassium: 4.4 mmol/L (ref 3.5–5.3)
Sodium: 136 mmol/L (ref 135–146)
Total Bilirubin: 0.9 mg/dL (ref 0.2–1.2)
Total Protein: 7.1 g/dL (ref 6.1–8.1)
eGFR: 64 mL/min/1.73m2 (ref >=60)

## 2024-02-17 LAB — CBC WITH DIFFERENTIAL/PLATELET
Absolute Lymphocytes: 1610 {cells}/uL (ref 850–3900)
Absolute Monocytes: 482 {cells}/uL (ref 200–950)
Basophils Absolute: 33 {cells}/uL (ref 0–200)
Basophils Relative: 0.5 %
Eosinophils Absolute: 112 {cells}/uL (ref 15–500)
Eosinophils Relative: 1.7 %
HCT: 43.8 % (ref 38.5–50.0)
Hemoglobin: 15 g/dL (ref 13.2–17.1)
MCH: 34.6 pg — ABNORMAL HIGH (ref 27.0–33.0)
MCHC: 34.2 g/dL (ref 32.0–36.0)
MCV: 100.9 fL — ABNORMAL HIGH (ref 80.0–100.0)
MPV: 9.3 fL (ref 7.5–12.5)
Monocytes Relative: 7.3 %
Neutro Abs: 4363 {cells}/uL (ref 1500–7800)
Neutrophils Relative %: 66.1 %
Platelets: 283 Thousand/uL (ref 140–400)
RBC: 4.34 Million/uL (ref 4.20–5.80)
RDW: 12.1 % (ref 11.0–15.0)
Total Lymphocyte: 24.4 %
WBC: 6.6 Thousand/uL (ref 3.8–10.8)

## 2024-02-17 LAB — TSH: TSH: 4.23 m[IU]/L (ref 0.40–4.50)

## 2024-02-17 MED ORDER — PRAVASTATIN SODIUM 20 MG PO TABS: 20.0000 mg | ORAL_TABLET | Freq: Every day | ORAL | 2 refills | Status: DC

## 2024-02-17 NOTE — Addendum Note (Signed)
 Addended by: CANDISE ALEENE DEL on: 02/17/2024 02:01 PM   Modules accepted: Orders

## 2024-02-17 NOTE — Addendum Note (Signed)
 Addended by: FLETA CARE D on: 02/17/2024 02:47 PM   Modules accepted: Orders

## 2024-02-17 NOTE — Telephone Encounter (Signed)
 Okay, pravastatin  prescription sent. Fasting lipid panel, AST, and ALT in 2 to 3 months, diagnosis hypercholesterolemia.

## 2024-02-17 NOTE — Telephone Encounter (Signed)
 Please see pt response below.

## 2024-02-18 NOTE — Addendum Note (Signed)
 Addended by: FLETA CARE D on: 02/18/2024 10:57 AM   Modules accepted: Orders

## 2024-02-18 NOTE — Telephone Encounter (Signed)
 OK for cbc with diff, dx macrocytosis

## 2024-05-05 ENCOUNTER — Other Ambulatory Visit: Payer: Self-pay | Admitting: Family Medicine

## 2024-05-12 ENCOUNTER — Ambulatory Visit: Admitting: Podiatry

## 2024-05-12 DIAGNOSIS — B070 Plantar wart: Secondary | ICD-10-CM | POA: Diagnosis not present

## 2024-05-12 DIAGNOSIS — M7671 Peroneal tendinitis, right leg: Secondary | ICD-10-CM

## 2024-05-12 NOTE — Progress Notes (Unsigned)
 Right heel plantar verruca   Right peroneal tendinitis injection

## 2024-06-12 ENCOUNTER — Other Ambulatory Visit

## 2024-06-12 ENCOUNTER — Ambulatory Visit: Admitting: Neurology

## 2024-06-15 ENCOUNTER — Other Ambulatory Visit

## 2024-06-21 ENCOUNTER — Ambulatory Visit: Admitting: Podiatry

## 2025-01-03 ENCOUNTER — Ambulatory Visit

## 2025-01-30 ENCOUNTER — Ambulatory Visit: Admitting: Neurology

## 2025-02-16 ENCOUNTER — Encounter: Admitting: Family Medicine
